# Patient Record
Sex: Female | Born: 2000 | Race: White | Hispanic: No | Marital: Single | Smoking: Never smoker
Health system: Southern US, Community
[De-identification: ages and names within clinical notes are randomized; demographics above are authoritative.]

## PROBLEM LIST (undated history)

## (undated) DIAGNOSIS — Z9889 Other specified postprocedural states: Secondary | ICD-10-CM

## (undated) DIAGNOSIS — J45909 Unspecified asthma, uncomplicated: Secondary | ICD-10-CM

## (undated) DIAGNOSIS — G43909 Migraine, unspecified, not intractable, without status migrainosus: Secondary | ICD-10-CM

## (undated) DIAGNOSIS — J40 Bronchitis, not specified as acute or chronic: Secondary | ICD-10-CM

## (undated) DIAGNOSIS — F419 Anxiety disorder, unspecified: Secondary | ICD-10-CM

## (undated) DIAGNOSIS — R112 Nausea with vomiting, unspecified: Secondary | ICD-10-CM

## (undated) DIAGNOSIS — F909 Attention-deficit hyperactivity disorder, unspecified type: Secondary | ICD-10-CM

## (undated) HISTORY — DX: Bronchitis, not specified as acute or chronic: J40

## (undated) HISTORY — PX: TYMPANOSTOMY TUBE PLACEMENT: SHX32

## (undated) HISTORY — PX: TONSILLECTOMY AND ADENOIDECTOMY: SUR1326

## (undated) HISTORY — DX: Attention-deficit hyperactivity disorder, unspecified type: F90.9

---
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
## 2022-02-13 ENCOUNTER — Encounter: Payer: Self-pay | Admitting: Emergency Medicine

## 2022-02-13 ENCOUNTER — Emergency Department
Admission: EM | Admit: 2022-02-13 | Discharge: 2022-02-13 | Disposition: A | Discharge status: Home / Self Care | Payer: Medicaid Other | Attending: Emergency Medicine | Admitting: Emergency Medicine

## 2022-02-13 DIAGNOSIS — O26893 Other specified pregnancy related conditions, third trimester: Secondary | ICD-10-CM | POA: Diagnosis present

## 2022-02-13 DIAGNOSIS — O99713 Diseases of the skin and subcutaneous tissue complicating pregnancy, third trimester: Secondary | ICD-10-CM | POA: Insufficient documentation

## 2022-02-13 DIAGNOSIS — L237 Allergic contact dermatitis due to plants, except food: Secondary | ICD-10-CM | POA: Insufficient documentation

## 2022-02-13 DIAGNOSIS — Z3A31 31 weeks gestation of pregnancy: Secondary | ICD-10-CM | POA: Diagnosis not present

## 2022-02-13 MED ORDER — DIPHENHYDRAMINE HCL 25 MG PO CAPS
25.0000 mg | ORAL_CAPSULE | Freq: Once | ORAL | Status: AC
Start: 2022-02-13 — End: 2022-02-13
  Administered 2022-02-13: 25 mg via ORAL
  Filled 2022-02-13: qty 1

## 2022-02-13 MED ORDER — BETAMETHASONE VALERATE 0.1 % EX OINT: 1.0000 "application " | TOPICAL_OINTMENT | Freq: Two times a day (BID) | CUTANEOUS | 0 refills | Status: DC

## 2022-02-13 NOTE — ED Triage Notes (Signed)
Pt presents via POV 31 weeks preg with a rash from exposure to poison ivy last week. She notes trying OTC benadryl, baking soda baths, and oatmeal bath. Erythematous rash on her face, torso, and back. Respirations equal and unlabored.

## 2022-02-13 NOTE — ED Notes (Signed)
E-signature pad unavailable - Pt verbalized understanding of D/C information - no additional concerns at this time.  

## 2022-02-13 NOTE — ED Provider Notes (Signed)
[facility] Provider Note    None    (approximate)   History   Rash   HPI  Dominique Howard is a 21 y.o. female who presents to the ED for evaluation of Rash   Patient is [redacted] weeks gestation and presents to the ED with concerns for poison ivy rash.She reports her boyfriend works in Building control survey[employer] and got poison ivy oils on his clothes at work and left them out on the countertop in the bathroom.  She moved to these close around and since developed a rash on the right side of her face, her chest and her abdomen. Has been dealing with a rash for the past couple days now, using Benadryl with minimal relief.  She reports poor sleep that she comes in tonight.  Denies any gestational concerns.   Physical Exam   Triage Vital Signs: ED Triage Vitals  Enc Vitals Group     BP 02/13/22 0139 (!) 151/90     Pulse Rate 02/13/22 0139 98     Resp 02/13/22 0139 20     Temp 02/13/22 0139 98.8 F (37.1 C)     Temp Source 02/13/22 0139 Oral     SpO2 02/13/22 0139 98 %     Weight 02/13/22 0138 203 lb (92.1 kg)     Height 02/13/22 0138 5\' 1"  (1.549 m)     Head Circumference --      Peak Flow --      Pain Score 02/13/22 0138 0     Pain Loc --      Pain Edu? --      Excl. in GC? --     Most recent vital signs: Vitals:   02/13/22 0139  BP: (!) 151/90  Pulse: 98  Resp: 20  Temp: 98.8 F (37.1 C)  SpO2: 98%    General: Awake, no distress.  CV:  Good peripheral perfusion.  Resp:  Normal effort.  Abd:  No distention.  Gravid without tenderness. MSK:  No deformity noted.  Erythematous rash noted to the right side of the face, superior chest, gravid abdomen Neuro:  No focal deficits appreciated. Other:     ED Results / Procedures / Treatments   Labs (all labs ordered are listed, but only abnormal results are displayed) Labs Reviewed - No data to display  EKG   RADIOLOGY   Official radiology report(s): No results found.  PROCEDURES and  INTERVENTIONS:  Procedures  Medications - No data to display   IMPRESSION / MDM / ASSESSMENT AND PLAN / ED COURSE  I reviewed the triage vital signs and the nursing notes.  Differential diagnosis includes, but is not limited to, poison ivy/contact dermatitis, hives, herpes, labor  Pregnant patient presents to the ED with evidence of poison ivy contact dermatitis suitable for outpatient management.  No signs of gestational pathology or labor.  She looks systemically well and has a couple areas of evolving poison ivy.  Discussed inability to safely provide prednisone taper due to her pregnancy.  We discussed cautious use of small amounts of betamethasone cream in conjunction with Tylenol and Benadryl.  Discussed following up with OB and return precautions.      FINAL CLINICAL IMPRESSION(S) / ED DIAGNOSES   Final diagnoses:  None     Rx / DC Orders   ED Discharge Orders     None        Note:  This document was prepared using Dragon voice recognition software and may include  unintentional dictation errors.   Delton Prairie, MD 02/13/22 [phone]

## 2022-02-13 NOTE — Discharge Instructions (Signed)
Please use small quantities of this steroid cream to avoid absorbing too much into your system and affecting the baby.  Continue to use Tylenol and Benadryl

## 2022-03-29 ENCOUNTER — Observation Stay
Admission: EM | Admit: 2022-03-29 | Discharge: 2022-03-29 | Disposition: A | Discharge status: Home / Self Care | Payer: Medicaid Other | Attending: Obstetrics and Gynecology | Admitting: Obstetrics and Gynecology

## 2022-03-29 ENCOUNTER — Other Ambulatory Visit: Payer: Self-pay

## 2022-03-29 ENCOUNTER — Other Ambulatory Visit: Payer: Self-pay | Admitting: Obstetrics and Gynecology

## 2022-03-29 ENCOUNTER — Encounter: Payer: Self-pay | Admitting: Obstetrics and Gynecology

## 2022-03-29 DIAGNOSIS — Z3A37 37 weeks gestation of pregnancy: Secondary | ICD-10-CM | POA: Diagnosis not present

## 2022-03-29 DIAGNOSIS — O99513 Diseases of the respiratory system complicating pregnancy, third trimester: Secondary | ICD-10-CM | POA: Insufficient documentation

## 2022-03-29 DIAGNOSIS — Z79899 Other long term (current) drug therapy: Secondary | ICD-10-CM | POA: Insufficient documentation

## 2022-03-29 DIAGNOSIS — O321XX0 Maternal care for breech presentation, not applicable or unspecified: Secondary | ICD-10-CM | POA: Diagnosis present

## 2022-03-29 DIAGNOSIS — J45909 Unspecified asthma, uncomplicated: Secondary | ICD-10-CM | POA: Diagnosis not present

## 2022-03-29 DIAGNOSIS — O329XX0 Maternal care for malpresentation of fetus, unspecified, not applicable or unspecified: Secondary | ICD-10-CM | POA: Diagnosis present

## 2022-03-29 DIAGNOSIS — Z7722 Contact with and (suspected) exposure to environmental tobacco smoke (acute) (chronic): Secondary | ICD-10-CM | POA: Diagnosis not present

## 2022-03-29 LAB — CBC
HCT: 37.7 % (ref 36.0–46.0)
Hemoglobin: 12.2 g/dL (ref 12.0–15.0)
MCH: 28.8 pg (ref 26.0–34.0)
MCHC: 32.4 g/dL (ref 30.0–36.0)
MCV: 88.9 fL (ref 80.0–100.0)
Platelets: 200 10*3/uL (ref 150–400)
RBC: 4.24 MIL/uL (ref 3.87–5.11)
RDW: 14.7 % (ref 11.5–15.5)
WBC: 12 10*3/uL — ABNORMAL HIGH (ref 4.0–10.5)
nRBC: 0 % (ref 0.0–0.2)

## 2022-03-29 MED ORDER — MINERAL OIL LIGHT OIL: TOPICAL_OIL | Freq: Once | Status: DC

## 2022-03-29 MED ORDER — LACTATED RINGERS IV SOLN: INTRAVENOUS | Status: DC

## 2022-03-29 MED ORDER — FENTANYL CITRATE (PF) 100 MCG/2ML IJ SOLN
50.0000 ug | Freq: Once | INTRAMUSCULAR | Status: AC
  Administered 2022-03-29: 50 ug via INTRAVENOUS
  Filled 2022-03-29: qty 2

## 2022-03-29 MED ORDER — TERBUTALINE SULFATE 1 MG/ML IJ SOLN
0.2500 mg | Freq: Once | INTRAMUSCULAR | Status: AC
  Administered 2022-03-29: 0.25 mg via SUBCUTANEOUS
  Filled 2022-03-29: qty 1

## 2022-03-29 NOTE — Discharge Instructions (Signed)
Please return to hospital for any of the following symptoms:  -less than 10 fetal movements in a 2-hour period -contractions every 5 minutes for a 2-hour period  -bright, red vaginal bleeding -leakage of fluid  -"rock hard" abdomen that is not relaxing   

## 2022-03-29 NOTE — OB Triage Note (Signed)
Discharge instructions, labor precautions, and follow-up care reviewed with patient and significant other. All questions answered. Patient verbalized understanding. Discharged ambulatory off unit.   

## 2022-03-29 NOTE — Discharge Summary (Signed)
DC Summary Discharge Summary   Patient ID: Dominique Howard [id number] 21 y.o. Aug 05, 2001  Admit date: 03/29/2022  Discharge date: 03/29/2022  Principal Diagnoses:  1) malpresentation of fetus, antepartum 2) intrauterine pregnancy at [redacted]w[redacted]d   Secondary Diagnoses:  1) malpresentation of fetus, antepartum 2) intrauterine pregnancy at [redacted]w[redacted]d   Procedures performed during the hospitalization:  External cephalic version (failed)  HPI: 21 y.o. G1P0 female who has a fetus in breech presentation. She was counseled about the risks and benefits of the procedure and signed consents. Prior to iv placement the fetal breech was verified.  She underwent an attempt at external cephalic version, which was unsuccessful.    Past Medical History:  Diagnosis Date   ADHD (attention deficit hyperactivity disorder)    Anxiety    Asthma    well controlled   Bronchitis     Past Surgical History:  Procedure Laterality Date   LAPAROTOMY N/A 10/12/2021   Procedure: EXPLORATORY LAPAROTOMY;  Surgeon: Schermerhorn, Ihor Austin, MD;  Location: [facility] ORS;  Service: Gynecology;  Laterality: N/A;   OVARIAN CYST REMOVAL Bilateral 10/12/2021   Procedure: EX LAP BILATERAL OVARIAN CYSTECOMY;  Surgeon: Schermerhorn, Ihor Austin, MD;  Location: [facility] ORS;  Service: Gynecology;  Laterality: Bilateral;   TONSILLECTOMY AND ADENOIDECTOMY     TYMPANOSTOMY TUBE PLACEMENT     x2    No Known Allergies  Social History   Tobacco Use   Smoking status: Never    Passive exposure: Yes   Smokeless tobacco: Never  Vaping Use   Vaping Use: Former   Quit date: 08/02/2021  Substance Use Topics   Alcohol use: No   Drug use: Not Currently    Types: Marijuana    Comment: last used prior to finding out she was pregnant    Family History  Problem Relation Age of Onset   Hypertension Mother    Asthma Mother    Migraines Mother    Arthritis Mother    Cancer Mother        ovarian    Heart failure Mother    Asthma Sister    Mental  illness Sister    Obesity Sister    Asthma Brother    Arthritis Maternal Grandmother    Osteoporosis Maternal Grandmother    Thyroid disease Maternal Grandfather    Heart disease Maternal Grandfather    Stroke Maternal Grandfather    Hypertension Paternal Grandmother    Stroke Paternal Grandmother    Asthma Brother     Hospital Course:  She was admitted for external cephalic version, which was unsuccessful. She and the fetus were monitored for 2 hours after with a category 1 tracing noted the whole time. She noted no vaginal bleeding or leaking of fluid. She was discharged in stable condition with a c-section scheduled for 04/10/22.    Discharge Exam: BP 133/76 (BP Location: Left Arm)   Pulse 90   Temp 98.5 F (36.9 C) (Oral)   Resp 18   Ht 5\' 2"  (1.575 m)   Wt 95.7 kg   LMP 07/24/2021   BMI 38.59 kg/m  Physical Exam Constitutional:      General: She is not in acute distress.    Appearance: Normal appearance.  HENT:     Head: Normocephalic and atraumatic.  Eyes:     General: No scleral icterus.    Conjunctiva/sclera: Conjunctivae normal.  Neurological:     General: No focal deficit present.     Mental Status: She is alert and oriented to person,  place, and time.     Cranial Nerves: No cranial nerve deficit.  Psychiatric:        Mood and Affect: Mood normal.        Behavior: Behavior normal.        Judgment: Judgment normal.      Condition at Discharge: Stable  Complications affecting treatment: None  Discharge Medications:  Allergies as of 03/29/2022   No Known Allergies      Medication List     STOP taking these medications    gabapentin 300 MG capsule Commonly known as: Neurontin   HYDROcodone-acetaminophen 5-325 MG tablet Commonly known as: NORCO/VICODIN   metoCLOPramide 10 MG tablet Commonly known as: REGLAN   ondansetron 4 MG tablet Commonly known as: Zofran       TAKE these medications    albuterol 108 (90 Base) MCG/ACT  inhaler Commonly known as: VENTOLIN HFA Take 1-2 puffs by mouth every 6 (six) hours as needed for shortness of breath or wheezing.   betamethasone valerate ointment 0.1 % Commonly known as: VALISONE Apply 1 application  topically 2 (two) times daily.   prenatal vitamin w/FE, FA 27-1 MG Tabs tablet Take 1 tablet by mouth daily at 12 noon.         Follow-up arrangements:  Keep routine prenatal appointment   Discharge Disposition: Discharge disposition: 01-Home or Self Care    Signed: Thomasene Mohair, MD, [facility] 03/29/2022 2:00 PM

## 2022-03-29 NOTE — Procedures (Signed)
The risks, benefits, and alternatives of the procedure were reviewed with the patient.  Greater than 1 hour of fetal monitoring with category 1 tracing obtained prior to procedure.  The patient already had an IV. She was given fentanyl 50 mcg IV x 1. She was given terbutaline [place] x 1.  Using intermittent ultrasound for fetal positioning and heart rate monitoring, the fetal breech was attempted to be elevated from the pelvis.  A small amount of elevation was obtained with an attempted forward roll of the fetus x 3.  Reverse roll of the fetus was attempted unsuccessfully.  The fetal breech was never fully able to be elevated from the pelvis.  The procedure were terminated at this point.  The patient tolerated the procedure well.  No evidence of fetal distress, no vaginal bleeding, no rupture of membranes.   Patient and fetus will be monitored for about 1-2 hours to ensure no labor and reassuring fetal status.  Fetal heart rate monitored intermittently by ultrasound and documented in EPIC at the time of monitoring.  Thomasene Mohair, MD 03/29/2022 1:47 PM

## 2022-03-29 NOTE — H&P (Signed)
OB History & Physical   History of Present Illness:  Chief Complaint: here for turning baby  HPI:  Dominique Howard is a 21 y.o. G1P0 female at [redacted]w[redacted]d.  Her pregnancy has been complicated by ovarian cysts which were removed early in pregnancy.  Her fetus has also been breech .    She denies contractions.   She denies leakage of fluid.   She denies vaginal bleeding.   She reports fetal movement.    Total weight gain for pregnancy: Not found.   Obstetrical Problem List: G1  Problems (from 03/29/22 to present)     No problems associated with this episode.        Maternal Medical History:   Past Medical History:  Diagnosis Date   ADHD (attention deficit hyperactivity disorder)    Anxiety    Asthma    well controlled   Bronchitis     Past Surgical History:  Procedure Laterality Date   LAPAROTOMY N/A 10/12/2021   Procedure: EXPLORATORY LAPAROTOMY;  Surgeon: Schermerhorn, Ihor Austin, MD;  Location: [facility] ORS;  Service: Gynecology;  Laterality: N/A;   OVARIAN CYST REMOVAL Bilateral 10/12/2021   Procedure: EX LAP BILATERAL OVARIAN CYSTECOMY;  Surgeon: Schermerhorn, Ihor Austin, MD;  Location: [facility] ORS;  Service: Gynecology;  Laterality: Bilateral;   TONSILLECTOMY AND ADENOIDECTOMY     TYMPANOSTOMY TUBE PLACEMENT     x2    No Known Allergies  Prior to Admission medications   Medication Sig Start Date End Date Taking? Authorizing Provider  prenatal vitamin w/FE, FA (PRENATAL 1 + 1) 27-1 MG TABS tablet Take 1 tablet by mouth daily at 12 noon.   Yes [provider]  albuterol (VENTOLIN HFA) 108 (90 Base) MCG/ACT inhaler Take 1-2 puffs by mouth every 6 (six) hours as needed for shortness of breath or wheezing. 11/06/16   [provider]  betamethasone valerate ointment (VALISONE) 0.1 % Apply 1 application  topically 2 (two) times daily. 02/13/22   Delton Prairie, MD  gabapentin (NEURONTIN) 300 MG capsule Take 1 capsule (300 mg total) by mouth at bedtime for 10 days. 10/13/21  10/23/21  Schermerhorn, Ihor Austin, MD  HYDROcodone-acetaminophen (NORCO/VICODIN) 5-325 MG tablet Take 1 tablet by mouth every 4 (four) hours as needed for moderate pain. Patient not taking: Reported on 03/29/2022 10/13/21 10/13/22  Schermerhorn, Ihor Austin, MD  metoCLOPramide (REGLAN) 10 MG tablet Take 10 mg by mouth every 6 (six) hours as needed for nausea.    [provider]  ondansetron (ZOFRAN) 4 MG tablet Take 1 tablet (4 mg total) by mouth daily as needed for nausea or vomiting. 10/13/21 10/13/22  Schermerhorn, Ihor Austin, MD    OB History  Gravida Para Term Preterm AB Living  1            SAB IAB Ectopic Multiple Live Births               # Outcome Date GA Lbr Len/2nd Weight Sex Delivery Anes PTL Lv  1 Current             [facility]  Social History: She  reports that she has never smoked. She has been exposed to tobacco smoke. She has never used smokeless tobacco. She reports that she does not currently use drugs after having used the following drugs: Marijuana. She reports that she does not drink alcohol.  Family History: family history includes Arthritis in her maternal grandmother and mother; Asthma in her brother, brother, mother, and sister;  Cancer in her mother; Heart disease in her maternal grandfather; Heart failure in her mother; Hypertension in her mother and paternal grandmother; Mental illness in her sister; Migraines in her mother; Obesity in her sister; Osteoporosis in her maternal grandmother; Stroke in her maternal grandfather and paternal grandmother; Thyroid disease in her maternal grandfather.   Review of Systems  Constitutional: Negative.   HENT: Negative.    Eyes: Negative.   Respiratory: Negative.    Cardiovascular: Negative.   Gastrointestinal: Negative.   Genitourinary: Negative.   Musculoskeletal: Negative.   Skin: Negative.   Neurological: Negative.   Psychiatric/Behavioral: Negative.       Physical Exam:  BP 133/76  (BP Location: Left Arm)   Pulse 90   Temp 98.5 F (36.9 C) (Oral)   Resp 18   Ht 5\' 2"  (1.575 m)   Wt 95.7 kg   LMP 07/24/2021   BMI 38.59 kg/m   Physical Exam Constitutional:      General: She is not in acute distress.    Appearance: Normal appearance. She is well-developed.  HENT:     Head: Normocephalic and atraumatic.  Eyes:     General: No scleral icterus.    Conjunctiva/sclera: Conjunctivae normal.  Cardiovascular:     Rate and Rhythm: Normal rate and regular rhythm.     Heart sounds: No murmur heard.    No friction rub. No gallop.  Pulmonary:     Effort: Pulmonary effort is normal. No respiratory distress.     Breath sounds: Normal breath sounds. No wheezing or rales.  Abdominal:     General: Bowel sounds are normal. There is no distension.     Palpations: Abdomen is soft. There is mass.     Tenderness: There is no abdominal tenderness. There is no guarding or rebound.     Hernia: Hernia: gravid, NT.  Musculoskeletal:        General: Normal range of motion.     Cervical back: Normal range of motion and neck supple.  Neurological:     General: No focal deficit present.     Mental Status: She is alert and oriented to person, place, and time.     Cranial Nerves: No cranial nerve deficit.  Skin:    General: Skin is warm and dry.     Findings: No erythema.  Psychiatric:        Mood and Affect: Mood normal.        Behavior: Behavior normal.        Judgment: Judgment normal.      Pertinent Results:  Prenatal Labs Blood type/Rh O positive  Antibody screen negative   Baseline FHR: 135 beats/min   Variability: moderate   Accelerations: present   Decelerations: absent Contractions: present frequency: irritability Overall assessment: cat 1  Bedside Ultrasound:  Number of Fetus: 1  Presentation: complete breech  Fluid: normal  Placental Location: fundal  Assessment:  Dominique Howard is a 21 y.o. G1P0 female at [redacted]w[redacted]d with malpresentation of the fetus, here for  External Cephalic Version.   Plan:  Admit to Labor & Delivery  CBC, T&S, Clrs, IVF Consented regarding risks and benefits of ECV, including but not limited to: failure, pain, Rupture of membranes, placental abruption, labor, fetal intolerance.  Fetwal well-being: reassuring Failed ECV. See note.   [redacted]w[redacted]d, MD 03/29/2022 1:08 PM

## 2022-04-05 ENCOUNTER — Other Ambulatory Visit: Payer: Self-pay | Admitting: Certified Nurse Midwife

## 2022-04-05 ENCOUNTER — Encounter
Admission: RE | Admit: 2022-04-05 | Discharge: 2022-04-05 | Disposition: A | Discharge status: Home / Self Care | Payer: Medicaid Other | Source: Ambulatory Visit | Attending: Obstetrics and Gynecology | Admitting: Obstetrics and Gynecology

## 2022-04-05 VITALS — Ht 62.0 in | Wt 210.0 lb

## 2022-04-05 DIAGNOSIS — Z01812 Encounter for preprocedural laboratory examination: Secondary | ICD-10-CM

## 2022-04-05 DIAGNOSIS — O321XX0 Maternal care for breech presentation, not applicable or unspecified: Secondary | ICD-10-CM

## 2022-04-05 HISTORY — DX: Other specified postprocedural states: R11.2

## 2022-04-05 HISTORY — DX: Other specified postprocedural states: Z98.890

## 2022-04-05 NOTE — Progress Notes (Signed)
G1P0 at [redacted]w[redacted]d, LMP not c/w early [place] at [redacted]w[redacted]d.  Scheduled for primary cesarean section for breech position on 04/10/2022.   Prenatal provider: [facility] Pregnancy complicated by: Breech position of the fetus Dermoid cysts, surgically removed at 10/12/21 Varicella Non-immune TSH low-normal Teenage pregnancy Possible accessory lobe of the placenta (not seen on follow-up)  Prenatal Labs: Blood type/Rh O pos  Antibody screen neg  Rubella Immune  Varicella NON-Immune  RPR NR  HBsAg Neg  HIV NR  GC neg  Chlamydia neg  Genetic screening cfDNA negative, XX  1 hour GTT 106  3 hour GTT N/a  GBS pos   Tdap: received from [facility] Flu: out of season Contraception: TBD Feeding preference: TBD  ____ Janyce Llanos, CNM  Certified Nurse Midwife [facility] OB[facility]

## 2022-04-05 NOTE — Patient Instructions (Addendum)
Your procedure is scheduled on: Tuesday, August 8  Arrival Time: Please call Labor and Delivery the day before your scheduled C-Section to find out your arrival time. [phone].  Arrival: If your arrival time is prior to 6:00 am, please enter through the Emergency Room Entrance and you will be directed to Labor and Delivery. If your arrival time is 6:00 am or later, please enter the Medical Mall and follow the greeter's instructions.  Support Person  The designated support person is not considered a Equities trader.  Any inpatient support person will be given a band identifying them as the patient's chosen support person and may stay with the patient around the clock.  Visitor Passes   All visitors, including children, need an identification sticker when visiting. These stickers must be worn where they can be seen.   Labor & Delivery  Laboring women of any age may have one designated support person and one other visitor aged 8 and older at a time, and a doula registered with [place] for the labor and delivery phase of their stay. (Doulas not registered with [facility] are counted as visitors.) The visitor may switch with other visitors. Visitation is allowed 24 hours per day.  No children under the age of 24. The designated support person or a visitor may stay overnight in the room.  Mother Baby Unit, OB Specialty and Gynecological Care  A designated support person and three other visitors of any age may visit. The three visitors may switch out. The designated support person or a visitor aged 19 or older may stay overnight in the room. During the postpartum period (up to 6 weeks), if the mother is the patient, she can have her newborn stay with her if there is another support person present who can be responsible for the baby.   REMEMBER: Instructions that are not followed completely may result in serious medical risk, up to and including death; or upon the discretion of your surgeon  and anesthesiologist your surgery may need to be rescheduled.  Do not eat food or drink after midnight the night before surgery.  No gum chewing, lozengers or hard candies.  DO NOT TAKE ANY MEDICATIONS THE MORNING OF SURGERY   Use your albuterol inhaler on the day of surgery and bring to the hospital.  One week prior to surgery: starting today, August 3 Stop Anti-inflammatories (NSAIDS) such as Advil, Aleve, Ibuprofen, Motrin, Naproxen, Naprosyn and Aspirin based products such as Excedrin, Goodys Powder, BC Powder. Stop ANY OVER THE COUNTER supplements until after surgery. You may however, continue to take Tylenol if needed for pain up until the day of surgery.  No Alcohol for 24 hours before or after surgery.  No Smoking including e-cigarettes for 24 hours prior to surgery.  No chewable tobacco products for at least 6 hours prior to surgery.  No nicotine patches on the day of surgery.  Do not use any "recreational" drugs for at least a week prior to your surgery.  Please be advised that the combination of cocaine and anesthesia may have negative outcomes, up to and including death. If you test positive for cocaine, your surgery will be cancelled.  On the morning of surgery brush your teeth with toothpaste and water, you may rinse your mouth with mouthwash if you wish. Do not swallow any toothpaste or mouthwash.  Use CHG wipes as directed on instruction sheet.  Do not wear jewelry, make-up, hairpins, clips or nail polish.  Do not wear lotions, powders, or  perfumes.   Do not shave body from the neck down 48 hours prior to surgery just in case you cut yourself which could leave a site for infection.  Also, freshly shaved skin may become irritated if using the CHG soap.  Contact lenses, hearing aids and dentures may not be worn into surgery.  Do not bring valuables to the hospital. [facility] is not responsible for any missing/lost belongings or valuables.   Notify your doctor  if there is any change in your medical condition (cold, fever, infection).  Wear comfortable clothing (specific to your surgery type) to the hospital.  After surgery, you can help prevent lung complications by doing breathing exercises.  Take deep breaths and cough every 1-2 hours. Your doctor may order a device called an Incentive Spirometer to help you take deep breaths. When coughing or sneezing, hold a pillow firmly against your incision with both hands. This is called "splinting." Doing this helps protect your incision. It also decreases belly discomfort.  Please call the Pre-admissions Testing Dept. at [phone] if you have any questions about these instructions.  Preparing the Skin Before Surgery     To help prevent the risk of infection at your surgical site, we are now providing you with rinse-free Sage 2% Chlorhexidine Gluconate (CHG) disposable wipes.  The night before surgery: Shower or bathe with warm water. Do not apply perfume, lotions, powders. Wait one hour after shower. Skin should be dry and cool. Open Sage wipe package - 6 disposable cloths are inside. Wipe body using one cloth for the right arm, one cloth for the left arm, one cloth for the right leg, one cloth for the left leg, one cloth for the chest/abdomen area (do not use on breasts if breast feeding), and one cloth for the back. 5. Do not rinse, allow to dry. 6. Skin may fee "tacky" for several minutes. 7. Dress in clean clothes. 8. Place clean sheets on your bed and do not sleep with pets.  REPEAT ABOVE ON THE MORNING OF SURGERY PRIOR TO ARRIVING TO THE HOSPITAL.

## 2022-04-09 ENCOUNTER — Encounter: Payer: Self-pay | Admitting: Urgent Care

## 2022-04-09 ENCOUNTER — Inpatient Hospital Stay: Admission: RE | Admit: 2022-04-09 | Payer: Medicaid Other | Source: Ambulatory Visit

## 2022-04-09 ENCOUNTER — Other Ambulatory Visit: Payer: Self-pay

## 2022-04-09 ENCOUNTER — Encounter: Payer: Self-pay | Admitting: Obstetrics and Gynecology

## 2022-04-09 ENCOUNTER — Observation Stay
Admission: AD | Admit: 2022-04-09 | Discharge: 2022-04-09 | Disposition: A | Discharge status: Home / Self Care | Payer: Medicaid Other | Source: Home / Self Care | Attending: Certified Nurse Midwife | Admitting: Certified Nurse Midwife

## 2022-04-09 DIAGNOSIS — Z3A39 39 weeks gestation of pregnancy: Secondary | ICD-10-CM | POA: Insufficient documentation

## 2022-04-09 DIAGNOSIS — O99513 Diseases of the respiratory system complicating pregnancy, third trimester: Secondary | ICD-10-CM | POA: Insufficient documentation

## 2022-04-09 DIAGNOSIS — J45909 Unspecified asthma, uncomplicated: Secondary | ICD-10-CM | POA: Insufficient documentation

## 2022-04-09 DIAGNOSIS — O479 False labor, unspecified: Secondary | ICD-10-CM | POA: Diagnosis present

## 2022-04-09 DIAGNOSIS — Z7722 Contact with and (suspected) exposure to environmental tobacco smoke (acute) (chronic): Secondary | ICD-10-CM | POA: Insufficient documentation

## 2022-04-09 DIAGNOSIS — O471 False labor at or after 37 completed weeks of gestation: Secondary | ICD-10-CM | POA: Insufficient documentation

## 2022-04-09 LAB — CBC
HCT: 34.9 % — ABNORMAL LOW (ref 36.0–46.0)
Hemoglobin: 11.3 g/dL — ABNORMAL LOW (ref 12.0–15.0)
MCH: 28.3 pg (ref 26.0–34.0)
MCHC: 32.4 g/dL (ref 30.0–36.0)
MCV: 87.5 fL (ref 80.0–100.0)
Platelets: 179 10*3/uL (ref 150–400)
RBC: 3.99 MIL/uL (ref 3.87–5.11)
RDW: 15.2 % (ref 11.5–15.5)
WBC: 10.6 10*3/uL — ABNORMAL HIGH (ref 4.0–10.5)
nRBC: 0 % (ref 0.0–0.2)

## 2022-04-09 LAB — TYPE AND SCREEN
ABO/RH(D): O POS
Antibody Screen: NEGATIVE

## 2022-04-09 MED ORDER — PRENATAL MULTIVITAMIN CH: 1.0000 | ORAL_TABLET | Freq: Every day | ORAL | Status: DC

## 2022-04-09 MED ORDER — ACETAMINOPHEN 325 MG PO TABS: 650.0000 mg | ORAL_TABLET | ORAL | Status: DC | PRN

## 2022-04-09 MED ORDER — CALCIUM CARBONATE ANTACID 500 MG PO CHEW: 2.0000 | CHEWABLE_TABLET | ORAL | Status: DC | PRN

## 2022-04-09 MED ORDER — LACTATED RINGERS IV BOLUS
1000.0000 mL | Freq: Once | INTRAVENOUS | Status: AC
  Administered 2022-04-09: 1000 mL via INTRAVENOUS

## 2022-04-09 NOTE — Discharge Summary (Signed)
Dominique Howard is a 21 y.o. female. She is at [redacted]w[redacted]d gestation. Patient's last menstrual period was 07/24/2021. Estimated Date of Delivery: 04/16/22  Prenatal care site: [facility]  Current pregnancy complicated by:  Breech position of the fetus Dermoid cysts, surgically removed at 10/12/21 Varicella Non-immune TSH low-normal Teenage pregnancy Possible accessory lobe of the placenta (not seen on follow-up)  Chief complaint: uterine contractions  Reports regular uterine contractions since last night. The contractions are not getting stronger or closer together. She has only had one cup of water today. She has a c-section scheduled for breech position tomorrow 04/10/22 @ 0730.  S: Resting comfortably. Occasional CTX, no VB.no LOF,  Active fetal movement. Denies: HA, visual changes, SOB, or RUQ/epigastric pain  Maternal Medical History:   Past Medical History:  Diagnosis Date   ADHD (attention deficit hyperactivity disorder)    Anxiety    Asthma    well controlled   Bronchitis    PONV (postoperative nausea and vomiting)     Past Surgical History:  Procedure Laterality Date   LAPAROTOMY N/A 10/12/2021   Procedure: EXPLORATORY LAPAROTOMY;  Surgeon: Schermerhorn, Ihor Austin, MD;  Location: [facility] ORS;  Service: Gynecology;  Laterality: N/A;   OVARIAN CYST REMOVAL Bilateral 10/12/2021   Procedure: EX LAP BILATERAL OVARIAN CYSTECOMY;  Surgeon: Schermerhorn, Ihor Austin, MD;  Location: [facility] ORS;  Service: Gynecology;  Laterality: Bilateral;   TONSILLECTOMY AND ADENOIDECTOMY     TYMPANOSTOMY TUBE PLACEMENT     x2    No Known Allergies  Prior to Admission medications   Medication Sig Start Date End Date Taking? Authorizing Provider  albuterol (VENTOLIN HFA) 108 (90 Base) MCG/ACT inhaler Take 1-2 puffs by mouth every 6 (six) hours as needed for shortness of breath or wheezing. 11/06/16   [provider]  prenatal vitamin w/FE, FA (PRENATAL 1 + 1) 27-1 MG TABS tablet  Take 1 tablet by mouth daily at 12 noon.    [provider]      Social History: She  reports that she has never smoked. She has been exposed to tobacco smoke. She has never used smokeless tobacco. She reports that she does not currently use drugs after having used the following drugs: Marijuana. She reports that she does not drink alcohol.  Family History: family history includes Arthritis in her maternal grandmother and mother; Asthma in her brother, brother, mother, and sister; Cancer in her mother; Heart disease in her maternal grandfather; Heart failure in her mother; Hypertension in her mother and paternal grandmother; Mental illness in her sister; Migraines in her mother; Obesity in her sister; Osteoporosis in her maternal grandmother; Stroke in her maternal grandfather and paternal grandmother; Thyroid disease in her maternal grandfather.  no history of gyn cancers  Review of Systems: A full review of systems was performed and negative except as noted in the HPI.     O:  LMP 07/24/2021  Results for orders placed or performed during the hospital encounter of 04/09/22 (from the past 48 hour(s))  CBC   Collection Time: 04/09/22  4:46 PM  Result Value Ref Range   WBC 10.6 (H) 4.0 - 10.5 K/uL   RBC 3.99 3.87 - 5.11 MIL/uL   Hemoglobin 11.3 (L) 12.0 - 15.0 g/dL   HCT 48.5 (L) 46.2 - 70.3 %   MCV 87.5 80.0 - 100.0 fL   MCH 28.3 26.0 - 34.0 pg   MCHC 32.4 30.0 - 36.0 g/dL   RDW 50.0 93.8 - 18.2 %  Platelets 179 150 - 400 K/uL   nRBC 0.0 0.0 - 0.2 %  Type and screen [facility]   Collection Time: 04/09/22  4:46 PM  Result Value Ref Range   ABO/RH(D) O POS    Antibody Screen NEG    Sample Expiration      04/12/2022,2359 Performed at [facility], [address] [place], [place] [address]      Constitutional: NAD, AAOx3  HE/ENT: extraocular movements grossly intact, moist mucous membranes CV: RRR PULM: nl respiratory effort,  CTABL     Abd: gravid, non-tender, non-distended, soft      Ext: Non-tender, Nonedematous   Psych: mood appropriate, speech normal Pelvic: fingertip/thick/-3  Pelvic exam: normal external genitalia, vulva, vagina, cervix, uterus and adnexa.  Fetal  monitoring: Cat 1 Appropriate for GA Baseline: 125bpm Variability: moderate Accelerations: present x >2 Decelerations absent Contractions q32min  A/P: 21 y.o. [redacted]w[redacted]d here for antenatal surveillance for uterine contractions  Principle Diagnosis:  Normal pregnancy in third trimester  Labor: not present. Contractions became less intense after 1L IV bolus Fetal Wellbeing: Reassuring Cat 1 tracing. Reactive NST  Pre-op labs drawn for c-section in the morning. Reviewed NPO after midnight and to come for c-section at 0530. Return if contractions become stronger or closer together. D/c home stable, precautions reviewed, follow-up as scheduled.    Janyce Llanos, CNM 04/09/2022 6:42 PM

## 2022-04-09 NOTE — OB Triage Note (Signed)
Patient presents to her preadmission appointment and informed that team that she was having contractions.  Patient states that her contractions started yesterday around 7pm.  Patient denies any vaginal bleeding and reports no leaking of fluid but her normal discharge as increased.  Patient reports that the baby is moving.  Patient denies any headache, blurred vision, dizziness, or nausea.

## 2022-04-09 NOTE — Progress Notes (Signed)
Wallet given to her mom to keep.

## 2022-04-10 ENCOUNTER — Other Ambulatory Visit: Payer: Self-pay

## 2022-04-10 ENCOUNTER — Inpatient Hospital Stay: Payer: Medicaid Other | Admitting: Anesthesiology

## 2022-04-10 ENCOUNTER — Encounter: Payer: Self-pay | Admitting: Obstetrics and Gynecology

## 2022-04-10 ENCOUNTER — Inpatient Hospital Stay
Admission: RE | Admit: 2022-04-10 | Discharge: 2022-04-12 | DRG: 787 | Disposition: A | Discharge status: Home / Self Care | Payer: Medicaid Other | Source: Ambulatory Visit | Attending: Obstetrics | Admitting: Obstetrics

## 2022-04-10 ENCOUNTER — Encounter: Admission: EM | Disposition: A | Discharge status: Home / Self Care | Payer: Self-pay | Source: Home / Self Care

## 2022-04-10 ENCOUNTER — Encounter
Admission: RE | Disposition: A | Discharge status: Home / Self Care | Payer: Self-pay | Source: Ambulatory Visit | Attending: Obstetrics

## 2022-04-10 DIAGNOSIS — O328XX0 Maternal care for other malpresentation of fetus, not applicable or unspecified: Secondary | ICD-10-CM | POA: Diagnosis present

## 2022-04-10 DIAGNOSIS — Z01812 Encounter for preprocedural laboratory examination: Secondary | ICD-10-CM

## 2022-04-10 DIAGNOSIS — Z23 Encounter for immunization: Secondary | ICD-10-CM

## 2022-04-10 DIAGNOSIS — Z3A39 39 weeks gestation of pregnancy: Secondary | ICD-10-CM

## 2022-04-10 DIAGNOSIS — D62 Acute posthemorrhagic anemia: Secondary | ICD-10-CM | POA: Diagnosis not present

## 2022-04-10 DIAGNOSIS — O329XX0 Maternal care for malpresentation of fetus, unspecified, not applicable or unspecified: Secondary | ICD-10-CM | POA: Diagnosis present

## 2022-04-10 DIAGNOSIS — O9081 Anemia of the puerperium: Secondary | ICD-10-CM | POA: Diagnosis not present

## 2022-04-10 DIAGNOSIS — O9952 Diseases of the respiratory system complicating childbirth: Secondary | ICD-10-CM | POA: Diagnosis present

## 2022-04-10 DIAGNOSIS — O321XX0 Maternal care for breech presentation, not applicable or unspecified: Secondary | ICD-10-CM

## 2022-04-10 DIAGNOSIS — O99824 Streptococcus B carrier state complicating childbirth: Secondary | ICD-10-CM | POA: Diagnosis present

## 2022-04-10 DIAGNOSIS — J45909 Unspecified asthma, uncomplicated: Secondary | ICD-10-CM | POA: Diagnosis present

## 2022-04-10 LAB — RPR: RPR Ser Ql: NONREACTIVE

## 2022-04-10 SURGERY — Surgical Case
Anesthesia: Spinal

## 2022-04-10 MED ORDER — OXYTOCIN-SODIUM CHLORIDE 30-0.9 UT/500ML-% IV SOLN
INTRAVENOUS | Status: DC | PRN
  Administered 2022-04-10: 30 [IU] via INTRAVENOUS

## 2022-04-10 MED ORDER — ONDANSETRON HCL 4 MG/2ML IJ SOLN
4.0000 mg | Freq: Three times a day (TID) | INTRAMUSCULAR | Status: DC | PRN
  Administered 2022-04-10: 4 mg via INTRAVENOUS
  Filled 2022-04-10: qty 2

## 2022-04-10 MED ORDER — OXYCODONE-ACETAMINOPHEN 5-325 MG PO TABS: 2.0000 | ORAL_TABLET | ORAL | Status: DC | PRN

## 2022-04-10 MED ORDER — SODIUM CHLORIDE 0.9% FLUSH: 3.0000 mL | INTRAVENOUS | Status: DC | PRN

## 2022-04-10 MED ORDER — SIMETHICONE 80 MG PO CHEW
80.0000 mg | CHEWABLE_TABLET | Freq: Three times a day (TID) | ORAL | Status: DC
  Administered 2022-04-10 – 2022-04-12 (×6): 80 mg via ORAL
  Filled 2022-04-10 (×6): qty 1

## 2022-04-10 MED ORDER — ALBUTEROL SULFATE (2.5 MG/3ML) 0.083% IN NEBU: 3.0000 mL | INHALATION_SOLUTION | Freq: Four times a day (QID) | RESPIRATORY_TRACT | Status: DC | PRN

## 2022-04-10 MED ORDER — NALOXONE HCL 4 MG/10ML IJ SOLN: 1.0000 ug/kg/h | INTRAVENOUS | Status: DC | PRN

## 2022-04-10 MED ORDER — FENTANYL CITRATE (PF) 100 MCG/2ML IJ SOLN: 25.0000 ug | INTRAMUSCULAR | Status: DC | PRN

## 2022-04-10 MED ORDER — ONDANSETRON HCL 4 MG/2ML IJ SOLN
INTRAMUSCULAR | Status: DC | PRN
  Administered 2022-04-10: 4 mg via INTRAVENOUS

## 2022-04-10 MED ORDER — OXYTOCIN-SODIUM CHLORIDE 30-0.9 UT/500ML-% IV SOLN: 2.5000 [IU]/h | INTRAVENOUS | Status: DC

## 2022-04-10 MED ORDER — MORPHINE SULFATE (PF) 0.5 MG/ML IJ SOLN: INTRAMUSCULAR | Status: DC | PRN

## 2022-04-10 MED ORDER — CHLORHEXIDINE GLUCONATE 0.12 % MT SOLN
15.0000 mL | Freq: Once | OROMUCOSAL | Status: AC
  Administered 2022-04-10: 15 mL via OROMUCOSAL
  Filled 2022-04-10: qty 15

## 2022-04-10 MED ORDER — LACTATED RINGERS IV SOLN: INTRAVENOUS | Status: DC

## 2022-04-10 MED ORDER — OXYCODONE-ACETAMINOPHEN 5-325 MG PO TABS
1.0000 | ORAL_TABLET | ORAL | Status: DC | PRN
  Administered 2022-04-11 – 2022-04-12 (×2): 1 via ORAL
  Filled 2022-04-10 (×2): qty 1

## 2022-04-10 MED ORDER — SOD CITRATE-CITRIC ACID 500-334 MG/5ML PO SOLN: 30.0000 mL | ORAL | Status: AC

## 2022-04-10 MED ORDER — OXYCODONE HCL 5 MG/5ML PO SOLN: 5.0000 mg | Freq: Once | ORAL | Status: AC | PRN

## 2022-04-10 MED ORDER — DIBUCAINE (PERIANAL) 1 % EX OINT: 1.0000 | TOPICAL_OINTMENT | CUTANEOUS | Status: DC | PRN

## 2022-04-10 MED ORDER — BUPIVACAINE HCL (PF) 0.5 % IJ SOLN: 5.0000 mL | Freq: Once | INTRAMUSCULAR | Status: DC

## 2022-04-10 MED ORDER — ACETAMINOPHEN 325 MG PO TABS
650.0000 mg | ORAL_TABLET | Freq: Four times a day (QID) | ORAL | Status: AC
  Administered 2022-04-10 – 2022-04-11 (×4): 650 mg via ORAL
  Filled 2022-04-10 (×5): qty 2

## 2022-04-10 MED ORDER — MORPHINE SULFATE (PF) 0.5 MG/ML IJ SOLN
INTRAMUSCULAR | Status: DC | PRN
  Administered 2022-04-10: .1 mg via INTRATHECAL

## 2022-04-10 MED ORDER — PRENATAL MULTIVITAMIN CH
1.0000 | ORAL_TABLET | Freq: Every day | ORAL | Status: DC
  Administered 2022-04-11: 1 via ORAL
  Filled 2022-04-10 (×2): qty 1

## 2022-04-10 MED ORDER — BUPIVACAINE HCL (PF) 0.5 % IJ SOLN
INTRAMUSCULAR | Status: AC
  Filled 2022-04-10: qty 10

## 2022-04-10 MED ORDER — IBUPROFEN 600 MG PO TABS
600.0000 mg | ORAL_TABLET | Freq: Four times a day (QID) | ORAL | Status: DC
  Administered 2022-04-11 – 2022-04-12 (×3): 600 mg via ORAL
  Filled 2022-04-10 (×4): qty 1

## 2022-04-10 MED ORDER — DIPHENHYDRAMINE HCL 50 MG/ML IJ SOLN: 12.5000 mg | INTRAMUSCULAR | Status: DC | PRN

## 2022-04-10 MED ORDER — NALOXONE HCL 0.4 MG/ML IJ SOLN: 0.4000 mg | INTRAMUSCULAR | Status: DC | PRN

## 2022-04-10 MED ORDER — BUPIVACAINE 0.25 % ON-Q PUMP DUAL CATH 400 ML
400.0000 mL | INJECTION | Status: DC
  Filled 2022-04-10: qty 400

## 2022-04-10 MED ORDER — EPHEDRINE SULFATE (PRESSORS) 50 MG/ML IJ SOLN
INTRAMUSCULAR | Status: AC
  Filled 2022-04-10: qty 1

## 2022-04-10 MED ORDER — COCONUT OIL OIL: 1.0000 | TOPICAL_OIL | Status: DC | PRN

## 2022-04-10 MED ORDER — ONDANSETRON HCL 4 MG/2ML IJ SOLN
INTRAMUSCULAR | Status: AC
  Filled 2022-04-10: qty 2

## 2022-04-10 MED ORDER — IBUPROFEN 600 MG PO TABS: 600.0000 mg | ORAL_TABLET | Freq: Four times a day (QID) | ORAL | Status: DC

## 2022-04-10 MED ORDER — LIDOCAINE HCL (PF) 1 % IJ SOLN
INTRAMUSCULAR | Status: DC | PRN
  Administered 2022-04-10: 3 mL via SUBCUTANEOUS

## 2022-04-10 MED ORDER — OXYTOCIN-SODIUM CHLORIDE 30-0.9 UT/500ML-% IV SOLN
INTRAVENOUS | Status: AC
  Filled 2022-04-10: qty 500

## 2022-04-10 MED ORDER — VARICELLA VIRUS VACCINE LIVE 1350 PFU/0.5ML IJ SUSR
0.5000 mL | INTRAMUSCULAR | Status: AC | PRN
  Administered 2022-04-12: 0.5 mL via SUBCUTANEOUS
  Filled 2022-04-10: qty 0.5

## 2022-04-10 MED ORDER — PHENYLEPHRINE HCL-NACL 20-0.9 MG/250ML-% IV SOLN
INTRAVENOUS | Status: DC | PRN
  Administered 2022-04-10: 30 ug/min via INTRAVENOUS

## 2022-04-10 MED ORDER — EPHEDRINE SULFATE (PRESSORS) 50 MG/ML IJ SOLN
INTRAMUSCULAR | Status: DC | PRN
  Administered 2022-04-10: 10 mg via INTRAVENOUS
  Administered 2022-04-10: 15 mg via INTRAVENOUS

## 2022-04-10 MED ORDER — BUPIVACAINE IN DEXTROSE 0.75-8.25 % IT SOLN
INTRATHECAL | Status: DC | PRN
  Administered 2022-04-10: 1.4 mL via INTRATHECAL

## 2022-04-10 MED ORDER — DIPHENHYDRAMINE HCL 25 MG PO CAPS: 25.0000 mg | ORAL_CAPSULE | ORAL | Status: DC | PRN

## 2022-04-10 MED ORDER — SOD CITRATE-CITRIC ACID 500-334 MG/5ML PO SOLN
ORAL | Status: AC
  Administered 2022-04-10: 30 mL via ORAL
  Filled 2022-04-10: qty 15

## 2022-04-10 MED ORDER — LACTATED RINGERS IV SOLN: Freq: Once | INTRAVENOUS | Status: DC

## 2022-04-10 MED ORDER — MORPHINE SULFATE (PF) 0.5 MG/ML IJ SOLN
INTRAMUSCULAR | Status: AC
  Filled 2022-04-10: qty 10

## 2022-04-10 MED ORDER — WITCH HAZEL-GLYCERIN EX PADS: 1.0000 | MEDICATED_PAD | CUTANEOUS | Status: DC | PRN

## 2022-04-10 MED ORDER — PHENYLEPHRINE HCL-NACL 20-0.9 MG/250ML-% IV SOLN
INTRAVENOUS | Status: AC
  Filled 2022-04-10: qty 250

## 2022-04-10 MED ORDER — DIPHENHYDRAMINE HCL 25 MG PO CAPS: 25.0000 mg | ORAL_CAPSULE | Freq: Four times a day (QID) | ORAL | Status: DC | PRN

## 2022-04-10 MED ORDER — FENTANYL CITRATE (PF) 100 MCG/2ML IJ SOLN
INTRAMUSCULAR | Status: DC | PRN
Start: 2022-04-10 — End: 2022-04-10
  Administered 2022-04-10: 15 ug via INTRATHECAL

## 2022-04-10 MED ORDER — CEFAZOLIN SODIUM-DEXTROSE 2-4 GM/100ML-% IV SOLN
2.0000 g | INTRAVENOUS | Status: AC
  Administered 2022-04-10: 2 g via INTRAVENOUS
  Filled 2022-04-10: qty 100

## 2022-04-10 MED ORDER — ORAL CARE MOUTH RINSE: 15.0000 mL | Freq: Once | OROMUCOSAL | Status: AC

## 2022-04-10 MED ORDER — BUPIVACAINE HCL (PF) 0.5 % IJ SOLN
INTRAMUSCULAR | Status: DC | PRN
  Administered 2022-04-10: 10 mL

## 2022-04-10 MED ORDER — OXYCODONE HCL 5 MG PO TABS
5.0000 mg | ORAL_TABLET | Freq: Once | ORAL | Status: AC | PRN
  Administered 2022-04-10: 5 mg via ORAL

## 2022-04-10 MED ORDER — FENTANYL CITRATE (PF) 100 MCG/2ML IJ SOLN
INTRAMUSCULAR | Status: AC
  Filled 2022-04-10: qty 2

## 2022-04-10 MED ORDER — MENTHOL 3 MG MT LOZG: 1.0000 | LOZENGE | OROMUCOSAL | Status: DC | PRN

## 2022-04-10 MED ORDER — SENNOSIDES-DOCUSATE SODIUM 8.6-50 MG PO TABS
2.0000 | ORAL_TABLET | ORAL | Status: DC
  Administered 2022-04-10 – 2022-04-11 (×2): 2 via ORAL
  Filled 2022-04-10 (×2): qty 2

## 2022-04-10 MED ORDER — FERROUS SULFATE 325 (65 FE) MG PO TABS
325.0000 mg | ORAL_TABLET | Freq: Two times a day (BID) | ORAL | Status: DC
  Administered 2022-04-10 – 2022-04-12 (×4): 325 mg via ORAL
  Filled 2022-04-10 (×4): qty 1

## 2022-04-10 MED ORDER — MEPERIDINE HCL 25 MG/ML IJ SOLN: 6.2500 mg | INTRAMUSCULAR | Status: DC | PRN

## 2022-04-10 MED ORDER — KETOROLAC TROMETHAMINE 30 MG/ML IJ SOLN
30.0000 mg | Freq: Four times a day (QID) | INTRAMUSCULAR | Status: AC
  Administered 2022-04-10 – 2022-04-11 (×4): 30 mg via INTRAVENOUS
  Filled 2022-04-10 (×5): qty 1

## 2022-04-10 MED ORDER — OXYTOCIN 10 UNIT/ML IJ SOLN
INTRAMUSCULAR | Status: AC
  Filled 2022-04-10: qty 1

## 2022-04-10 MED ORDER — OXYCODONE HCL 5 MG PO TABS
5.0000 mg | ORAL_TABLET | Freq: Four times a day (QID) | ORAL | Status: AC | PRN
  Filled 2022-04-10: qty 1

## 2022-04-10 MED ORDER — 0.9 % SODIUM CHLORIDE (POUR BTL) OPTIME
TOPICAL | Status: DC | PRN
  Administered 2022-04-10: 1000 mL

## 2022-04-10 MED ORDER — KETOROLAC TROMETHAMINE 30 MG/ML IJ SOLN: 30.0000 mg | Freq: Four times a day (QID) | INTRAMUSCULAR | Status: AC

## 2022-04-10 SURGICAL SUPPLY — 32 items
CATH KIT ON-Q SILVERSOAK 5 (CATHETERS) ×2 IMPLANT
CATH KIT ON-Q SILVERSOAK 5IN (CATHETERS) ×4 IMPLANT
DERMABOND ADVANCED (GAUZE/BANDAGES/DRESSINGS) ×1
DERMABOND ADVANCED .7 DNX12 (GAUZE/BANDAGES/DRESSINGS) ×1 IMPLANT
DRSG OPSITE POSTOP 4X10 (GAUZE/BANDAGES/DRESSINGS) ×2 IMPLANT
DRSG TELFA 3X8 NADH (GAUZE/BANDAGES/DRESSINGS) ×2 IMPLANT
ELECT CAUTERY BLADE 6.4 (BLADE) ×2 IMPLANT
ELECT REM PT RETURN 9FT ADLT (ELECTROSURGICAL) ×2
ELECTRODE REM PT RTRN 9FT ADLT (ELECTROSURGICAL) ×1 IMPLANT
GAUZE SPONGE 4X4 12PLY STRL (GAUZE/BANDAGES/DRESSINGS) ×2 IMPLANT
GLOVE BIO SURGEON STRL SZ7 (GLOVE) ×2 IMPLANT
GLOVE SURG UNDER LTX SZ7.5 (GLOVE) ×2 IMPLANT
GOWN STRL REUS W/ TWL LRG LVL3 (GOWN DISPOSABLE) ×3 IMPLANT
GOWN STRL REUS W/TWL LRG LVL3 (GOWN DISPOSABLE) ×3
MANIFOLD NEPTUNE II (INSTRUMENTS) ×2 IMPLANT
MAT PREVALON FULL STRYKER (MISCELLANEOUS) ×2 IMPLANT
NS IRRIG 1000ML POUR BTL (IV SOLUTION) ×2 IMPLANT
PACK C SECTION AR (MISCELLANEOUS) ×2 IMPLANT
PAD DRESSING TELFA 3X8 NADH (GAUZE/BANDAGES/DRESSINGS) ×1 IMPLANT
PAD OB MATERNITY 4.3X12.25 (PERSONAL CARE ITEMS) ×4 IMPLANT
PAD PREP 24X41 OB/GYN DISP (PERSONAL CARE ITEMS) ×2 IMPLANT
SCRUB CHG 4% DYNA-HEX 4OZ (MISCELLANEOUS) ×2 IMPLANT
STRIP CLOSURE SKIN 1/2X4 (GAUZE/BANDAGES/DRESSINGS) ×2 IMPLANT
SUT MNCRL 4-0 (SUTURE) ×1
SUT MNCRL 4-0 27XMFL (SUTURE) ×1
SUT PDS AB 1 TP1 96 (SUTURE) ×2 IMPLANT
SUT PLAIN GUT 0 (SUTURE) IMPLANT
SUT VIC AB 0 CTX 36 (SUTURE) ×3
SUT VIC AB 0 CTX36XBRD ANBCTRL (SUTURE) ×2 IMPLANT
SUTURE MNCRL 4-0 27XMF (SUTURE) ×1 IMPLANT
SWABSTK COMLB BENZOIN TINCTURE (MISCELLANEOUS) ×2 IMPLANT
WATER STERILE IRR 500ML POUR (IV SOLUTION) ×2 IMPLANT

## 2022-04-10 NOTE — Anesthesia Postprocedure Evaluation (Signed)
Anesthesia Post Note  Patient: Arta Bruce  Procedure(s) Performed: CESAREAN SECTION  Patient location during evaluation: PACU Anesthesia Type: Spinal Level of consciousness: awake and alert Pain management: pain level controlled Vital Signs Assessment: post-procedure vital signs reviewed and stable Respiratory status: spontaneous breathing, nonlabored ventilation and respiratory function stable Cardiovascular status: blood pressure returned to baseline and stable Postop Assessment: no apparent nausea or vomiting Anesthetic complications: no   No notable events documented.   Last Vitals:  Vitals:   04/10/22 1122 04/10/22 1206  BP:  124/72  Pulse: (!) 55 78  Resp:  20  Temp:  36.4 C  SpO2:  99%    Last Pain:  Vitals:   04/10/22 1210  TempSrc:   PainSc: 3                  Foye Deer

## 2022-04-10 NOTE — Anesthesia Preprocedure Evaluation (Signed)
Anesthesia Evaluation  Patient identified by MRN, date of birth, ID band Patient awake    Reviewed: Allergy & Precautions, NPO status , Patient's Chart, lab work & pertinent test results  History of Anesthesia Complications (+) PONV and history of anesthetic complications  Airway Mallampati: III  TM Distance: >3 FB Neck ROM: full    Dental  (+) Chipped   Pulmonary neg shortness of breath, asthma ,    Pulmonary exam normal        Cardiovascular Exercise Tolerance: Good (-) hypertension(-) angina(-) Past MI Normal cardiovascular exam     Neuro/Psych Anxiety    GI/Hepatic negative GI ROS, neg GERD  ,  Endo/Other    Renal/GU   negative genitourinary   Musculoskeletal   Abdominal   Peds  Hematology negative hematology ROS (+)   Anesthesia Other Findings Past Medical History: No date: ADHD (attention deficit hyperactivity disorder) No date: Anxiety No date: Asthma     Comment:  well controlled No date: Bronchitis No date: PONV (postoperative nausea and vomiting)  Past Surgical History: 10/12/2021: LAPAROTOMY; N/A     Comment:  Procedure: EXPLORATORY LAPAROTOMY;  Surgeon:               Suzy Bouchard, MD;  Location: [facility] ORS;                Service: Gynecology;  Laterality: N/A; 10/12/2021: OVARIAN CYST REMOVAL; Bilateral     Comment:  Procedure: EX LAP BILATERAL OVARIAN CYSTECOMY;  Surgeon:              Schermerhorn, Ihor Austin, MD;  Location: [facility] ORS;                Service: Gynecology;  Laterality: Bilateral; No date: TONSILLECTOMY AND ADENOIDECTOMY No date: TYMPANOSTOMY TUBE PLACEMENT     Comment:  x2  BMI    Body Mass Index: 38.41 kg/m      Reproductive/Obstetrics (+) Pregnancy                             Anesthesia Physical Anesthesia Plan  ASA: 3  Anesthesia Plan: Spinal   Post-op Pain Management:    Induction:   PONV Risk Score and Plan:   Airway Management  Planned: Natural Airway and Nasal Cannula  Additional Equipment:   Intra-op Plan:   Post-operative Plan:   Informed Consent: I have reviewed the patients History and Physical, chart, labs and discussed the procedure including the risks, benefits and alternatives for the proposed anesthesia with the patient or authorized representative who has indicated his/her understanding and acceptance.     Dental Advisory Given  Plan Discussed with: Anesthesiologist, CRNA and Surgeon  Anesthesia Plan Comments: (Patient reports no bleeding problems and no anticoagulant use.  Plan for spinal with backup GA  Patient consented for risks of anesthesia including but not limited to:  - adverse reactions to medications - damage to eyes, teeth, lips or other oral mucosa - nerve damage due to positioning  - risk of bleeding, infection and or nerve damage from spinal that could lead to paralysis - risk of headache or failed spinal - damage to teeth, lips or other oral mucosa - sore throat or hoarseness - damage to heart, brain, nerves, lungs, other parts of body or loss of life  Patient voiced understanding.)        Anesthesia Quick Evaluation

## 2022-04-10 NOTE — H&P (Signed)
OB History & Physical   History of Present Illness:  Chief Complaint: Here for c-section  HPI:  Dominique Howard is a 21 y.o. G1P0 female at [redacted]w[redacted]d.  Her pregnancy has been complicated by ovarian cysts which were removed early in pregnancy. Her fetus is also breech and had a failed ECV a couple of weeks ago .    She denies contractions.   She denies leakage of fluid.   She denies vaginal bleeding.   She reports fetal movement.    Total weight gain for pregnancy: Not found.   Obstetrical Problem List: G1  Problems (from 03/29/22 to present)     No problems associated with this episode.        Maternal Medical History:   Past Medical History:  Diagnosis Date   ADHD (attention deficit hyperactivity disorder)    Anxiety    Asthma    well controlled   Bronchitis    PONV (postoperative nausea and vomiting)     Past Surgical History:  Procedure Laterality Date   LAPAROTOMY N/A 10/12/2021   Procedure: EXPLORATORY LAPAROTOMY;  Surgeon: Schermerhorn, Ihor Austin, MD;  Location: [facility] ORS;  Service: Gynecology;  Laterality: N/A;   OVARIAN CYST REMOVAL Bilateral 10/12/2021   Procedure: EX LAP BILATERAL OVARIAN CYSTECOMY;  Surgeon: Schermerhorn, Ihor Austin, MD;  Location: [facility] ORS;  Service: Gynecology;  Laterality: Bilateral;   TONSILLECTOMY AND ADENOIDECTOMY     TYMPANOSTOMY TUBE PLACEMENT     x2    No Known Allergies  Prior to Admission medications   Medication Sig Start Date End Date Taking? Authorizing Provider  albuterol (VENTOLIN HFA) 108 (90 Base) MCG/ACT inhaler Take 1-2 puffs by mouth every 6 (six) hours as needed for shortness of breath or wheezing. 11/06/16   [provider]  prenatal vitamin w/FE, FA (PRENATAL 1 + 1) 27-1 MG TABS tablet Take 1 tablet by mouth daily at 12 noon.    [provider]    OB History  Gravida Para Term Preterm AB Living  1            SAB IAB Ectopic Multiple Live Births               # Outcome Date GA Lbr Len/2nd Weight  Sex Delivery Anes PTL Lv  1 Current             [facility]renatal care site: [facility]  Social History: She  reports that she has never smoked. She has been exposed to tobacco smoke. She has never used smokeless tobacco. She reports that she does not currently use drugs after having used the following drugs: Marijuana. She reports that she does not drink alcohol.  Family History: family history includes Arthritis in her maternal grandmother and mother; Asthma in her brother, brother, mother, and sister; Cancer in her mother; Heart disease in her maternal grandfather; Heart failure in her mother; Hypertension in her mother and paternal grandmother; Mental illness in her sister; Migraines in her mother; Obesity in her sister; Osteoporosis in her maternal grandmother; Stroke in her maternal grandfather and paternal grandmother; Thyroid disease in her maternal grandfather.   Review of Systems:  Review of Systems  Constitutional: Negative.   HENT: Negative.    Eyes: Negative.   Respiratory: Negative.    Cardiovascular: Negative.   Gastrointestinal: Negative.   Genitourinary: Negative.   Musculoskeletal: Negative.   Skin: Negative.   Neurological: Negative.   Psychiatric/Behavioral: Negative.       Physical Exam:  BP Marland Kitchen)  130/56 (BP Location: Right Arm)   Pulse (!) 106   Temp 98.4 F (36.9 C) (Oral)   Resp 18   Ht 5\' 2"  (1.575 m)   Wt 95.3 kg   LMP 07/24/2021   SpO2 98%   BMI 38.41 kg/m   Physical Exam Constitutional:      General: She is not in acute distress.    Appearance: Normal appearance. She is well-developed.  HENT:     Head: Normocephalic and atraumatic.  Eyes:     General: No scleral icterus.    Conjunctiva/sclera: Conjunctivae normal.  Cardiovascular:     Rate and Rhythm: Normal rate and regular rhythm.     Heart sounds: No murmur heard.    No friction rub. No gallop.  Pulmonary:     Effort: Pulmonary effort is normal. No respiratory distress.     Breath  sounds: Normal breath sounds. No wheezing or rales.  Abdominal:     General: Bowel sounds are normal. There is no distension.     Palpations: Abdomen is soft. There is mass (gravid, NT).     Tenderness: There is no abdominal tenderness. There is no guarding or rebound.  Musculoskeletal:        General: Normal range of motion.     Cervical back: Normal range of motion and neck supple.  Neurological:     General: No focal deficit present.     Mental Status: She is alert and oriented to person, place, and time.     Cranial Nerves: No cranial nerve deficit.  Skin:    General: Skin is warm and dry.     Findings: No erythema.  Psychiatric:        Mood and Affect: Mood normal.        Behavior: Behavior normal.        Judgment: Judgment normal.      Pertinent Results:   Blood type/Rh O positive  Antibody screen negative   Baseline FHR: 130 beats/min   Variability: moderate   Accelerations: present   Decelerations: absent Contractions: present frequency: irritability Overall assessment: cat 1  Bedside Ultrasound:  Number of Fetus: 1  Presentation: breech  Fluid: subjectively normal  Placental Location: fundal  Assessment:  Dominique Howard is a 21 y.o. G1P0 female at [redacted]w[redacted]d with malpresentation of the fetus.   Plan:  Admit to Labor & Delivery  CBC, T&S, NPO, IVF GBS positive - will get pre-op abx.   Fetwal well-being: reassuring Verified fetal breech as still presenting. Patient agrees to move forward with recommended surgery   [redacted]w[redacted]d, MD 04/10/2022 7:34 AM

## 2022-04-10 NOTE — Discharge Summary (Signed)
Postpartum Discharge Summary     Patient Name: Dominique Howard DOB: 2000-10-17 MRN: [id number]  Date of admission: 04/10/2022 Delivery date:04/10/2022  Delivering provider: Prentice Docker D  Date of discharge: 04/10/2022  Admitting diagnosis: Delivery of pregnancy by cesarean section [O82] Intrauterine pregnancy: [redacted]w[redacted]d    Secondary diagnosis:  Principal Problem:   Malpresentation of fetus, antepartum Active Problems:   Delivery of pregnancy by cesarean section   [redacted] weeks gestation of pregnancy  Additional problems: ***    Discharge diagnosis: Term Pregnancy Delivered                                              Post partum procedures:{Postpartum procedures:23558} Augmentation: N/A Complications: None  Hospital course: Sceduled C/S   21y.o. yo G1P1001 at 371w1das admitted to the hospital 04/10/2022 for scheduled cesarean section with the following indication:Malpresentation.Delivery details are as follows:  Membrane Rupture Time/Date: 8:23 AM ,04/10/2022   Delivery Method:C-Section, Low Transverse  Details of operation can be found in separate operative note.  Patient had an uncomplicated postpartum course.  She is ambulating, tolerating a regular diet, passing flatus, and urinating well. Patient is discharged home in stable condition on  04/10/22        Newborn Data: Birth date:04/10/2022  Birth time:8:26 AM  Gender:Female  Living status:Living  Apgars: ,  Weight:     Magnesium Sulfate received: No BMZ received: No Rhophylac:N/A MMR:No T-DaP:{Tdap:23962} Flu: {F{VZC:58850}ransfusion:{Transfusion received:30440034}  Physical exam  Vitals:   04/10/22 0630 04/10/22 0634 04/10/22 0635 04/10/22 0700  BP:      Pulse:      Resp:      Temp:      TempSrc:      SpO2: 98%  99% 98%  Weight:  95.3 kg    Height:  5' 2" (1.575 m)     General: {Exam; general:21111117} Lochia: {Desc; appropriate/inappropriate:30686::"appropriate"} Uterine Fundus: {Desc;  firm/soft:30687} Incision: {Exam; incision:21111123} DVT Evaluation: {Exam; dvt:2111122} Labs: Lab Results  Component Value Date   WBC 10.6 (H) 04/09/2022   HGB 11.3 (L) 04/09/2022   HCT 34.9 (L) 04/09/2022   MCV 87.5 04/09/2022   PLT 179 04/09/2022      Latest Ref Rng & Units 10/16/2021   10:29 PM  CMP  Glucose 70 - 99 mg/dL 88   BUN 6 - 20 mg/dL 6   Creatinine 0.44 - 1.00 mg/dL 0.52   Sodium 135 - 145 mmol/L 130   Potassium 3.5 - 5.1 mmol/L 3.5   Chloride 98 - 111 mmol/L 100   CO2 22 - 32 mmol/L 20   Calcium 8.9 - 10.3 mg/dL 9.5   Total Protein 6.5 - 8.1 g/dL 7.3   Total Bilirubin 0.3 - 1.2 mg/dL 0.6   Alkaline Phos 38 - 126 U/L 48   AST 15 - 41 U/L 25   ALT 0 - 44 U/L 27    Edinburgh Score:     No data to display            After visit meds:  Allergies as of 04/10/2022   No Known Allergies   Med Rec must be completed prior to using this [facility]*        Discharge home in stable condition Infant Feeding: {Baby feeding:23562} Infant Disposition:{CHL IP OB HOME WITH MOYDXAJO:87867}ischarge instruction: per After Visit Summary and Postpartum booklet. Activity: Advance  as tolerated. Pelvic rest for 6 weeks.  Diet: routine diet Anticipated Birth Control: {Birth Control:23956} Postpartum Appointment:6 weeks Additional Postpartum F/U: Incision check 1 week Future Appointments:No future appointments. Follow up Visit:  Follow-up Information     Will Bonnet, MD. Go in 1 week(s).   Specialty: Obstetrics and Gynecology Why: For incision check (keep previously scheduled appt) Contact information: [place] [address] [phone]                 SIGNED: ***

## 2022-04-10 NOTE — Transfer of Care (Signed)
Immediate Anesthesia Transfer of Care Note  Patient: Dominique Howard  Procedure(s) Performed: CESAREAN SECTION  Patient Location: PACU and Mother/Baby  Anesthesia Type:Spinal  Level of Consciousness: awake and patient cooperative  Airway & Oxygen Therapy: Patient Spontanous Breathing  Post-op Assessment: Report given to RN and Post -op Vital signs reviewed and stable  Post vital signs: Reviewed and stable  Last Vitals:  Vitals Value Taken Time  BP    Temp    Pulse    Resp    SpO2      Last Pain:  Vitals:   04/10/22 0638  TempSrc:   PainSc: 1          Complications: No notable events documented.

## 2022-04-10 NOTE — Anesthesia Procedure Notes (Signed)
Spinal  Patient location during procedure: OR Start time: 04/10/2022 7:45 AM End time: 04/10/2022 7:50 AM Reason for block: surgical anesthesia Staffing Performed: resident/CRNA  Anesthesiologist: Piscitello, Precious Haws, MD Resident/CRNA: Jerrye Noble, CRNA Performed by: Jerrye Noble, CRNA Authorized by: Andria Frames, MD   Preanesthetic Checklist Completed: patient identified, IV checked, site marked, risks and benefits discussed, surgical consent, monitors and equipment checked, pre-op evaluation and timeout performed Spinal Block Patient position: sitting Prep: ChloraPrep Patient monitoring: heart rate, continuous pulse ox, blood pressure and cardiac monitor Approach: midline Location: L3-4 Injection technique: single-shot Needle Needle type: Introducer and Pencan  Needle gauge: 24 G Needle length: 9 cm Assessment Sensory level: T2 Events: CSF return Additional Notes Sterile aseptic technique used throughout the procedure.  Negative paresthesia. Negative blood return. Positive free-flowing CSF. Expiration date of kit checked and confirmed. Patient tolerated procedure well, without complications.

## 2022-04-10 NOTE — Op Note (Signed)
Cesarean Section Operative Note    Patient Name: Dominique Howard  MRN: [id number]  Date of Surgery: 04/10/2022   Pre-operative Diagnosis:  1) malpresentation of fetus, antepartum 2) intrauterine pregnancy at [redacted]w[redacted]d   Post-operative Diagnosis:  1) malpresentation of fetus, antepartum 2) intrauterine pregnancy at [redacted]w[redacted]d    Procedure: Primary Low Transverse Cesarean   Surgeon: Surgeon(s) and Role:    Conard Novak, MD - Primary   Assistants: Donato Schultz, CNM; No other capable assistant available, in surgery requiring high level assistant.  Anesthesia: spinal   Findings:  1) normal appearing gravid uterus, fallopian tubes, and ovaries 2) viable female infant with APGARs 7 and 9, Weight pending 3) Mild omental adhesions to lower abdominal wall near the bladder area.   Quantified Blood Loss: 840 mL  Total IV Fluids: 1,300 ml   Urine Output:  100 mL  Specimens: none  Complications: no complications  Disposition: PACU - hemodynamically stable.   Maternal Condition: stable   Baby condition / location:  NICU due to oxygen requirement with meconium noted in fluid.   Procedure Details:  The patient was seen in the Holding Room. The risks, benefits, complications, treatment options, and expected outcomes were discussed with the patient. The patient concurred with the proposed plan, giving informed consent. Identified as Dominique Howard and the procedure verified as C-Section Delivery. A Time Out was held and the above information confirmed.   After induction of anesthesia, the patient was draped and prepped in the usual sterile manner. A Pfannenstiel incision was made and carried down through the subcutaneous tissue to the fascia. Fascial incision was made and extended transversely. The fascia was separated from the underlying rectus tissue superiorly and inferiorly. The peritoneum was identified and entered. Peritoneal incision was extended longitudinally. The  bladder flap was not bluntly or sharply freed from the lower uterine segment. A low transverse uterine incision was made and the hysterotomy was extended with cranial-caudal tension. Delivered from breech (footling) presentation was a Living newborn infant(s) or Female with Apgar scores of 7 at one minute and 9 at five minutes. Cord ph was not sent the umbilical cord was clamped and cut cord blood was obtained for evaluation. The placenta was removed Intact and appeared normal. The uterine outline, tubes and ovaries appeared normal. The uterine incision was closed with running locked sutures of 0 Vicryl.  A second layer of the same suture was thrown in an imbricating fashion.  Hemostasis was assured.  The uterus was returned to the abdomen and the paracolic gutters were cleared of all clots and debris.  The omentum was divided by 2 Kelly clamps and transected with scissors. Each end was tied off with 0 Vicryl suture and verified to be hemostatic.  The rectus muscles were inspected and found to be hemostatic.  The On-Q catheter pumps were inserted in accordance with the manufacturer's recommendations.  The catheters were inserted approximately 4cm cephelad to the incision line, approximately 1cm apart, straddling the midline.  They were inserted to a depth of the 4th mark. They were positioned superficial to the rectus abdominus muscles and deep to the rectus fascia.    The fascia was then reapproximated with running sutures of 1-0 PDS, looped. The subcutaneous tissue was reapproximated using 2-0 plain gut such that no greater than 2cm of dead space remained. The subcuticular closure was performed using 4-0 monocryl. The skin closure was reinforced using surgical skin glue.  The On-Q catheters were bolused with 5 mL of  0.5% marcaine plain for a total of 10 mL.  The catheters were affixed to the skin with surgical skin glue, steri-strips, and tegaderm.    The surgical assistant performed tissue retraction,  assistance with suturing, and fundal pressure.  Instrument, sponge, and needle counts were correct prior the abdominal closure and were correct at the conclusion of the case.  The patient received Ancef 2 gram IV prior to skin incision (within 30 minutes). For VTE prophylaxis she was wearing SCDs throughout the case.  The assistant surgeon was an MD due to lack of availability of another Sales promotion account executive.    Signed: Conard Novak, MD 04/10/2022 9:18 AM

## 2022-04-11 ENCOUNTER — Encounter: Payer: Self-pay | Admitting: Obstetrics and Gynecology

## 2022-04-11 LAB — CBC
HCT: 29.9 % — ABNORMAL LOW (ref 36.0–46.0)
Hemoglobin: 9.6 g/dL — ABNORMAL LOW (ref 12.0–15.0)
MCH: 28.4 pg (ref 26.0–34.0)
MCHC: 32.1 g/dL (ref 30.0–36.0)
MCV: 88.5 fL (ref 80.0–100.0)
Platelets: 167 10*3/uL (ref 150–400)
RBC: 3.38 MIL/uL — ABNORMAL LOW (ref 3.87–5.11)
RDW: 15.3 % (ref 11.5–15.5)
WBC: 14.2 10*3/uL — ABNORMAL HIGH (ref 4.0–10.5)
nRBC: 0 % (ref 0.0–0.2)

## 2022-04-11 MED ORDER — SODIUM CHLORIDE 0.9 % IV SOLN: INTRAVENOUS | Status: DC | PRN

## 2022-04-11 MED ORDER — SODIUM CHLORIDE 0.9 % IV SOLN
300.0000 mg | Freq: Once | INTRAVENOUS | Status: AC
  Administered 2022-04-11: 300 mg via INTRAVENOUS
  Filled 2022-04-11: qty 300

## 2022-04-11 NOTE — Progress Notes (Signed)
Postop Day  1  Subjective: no complaints, up ad lib, voiding, and tolerating PO  Doing well, no concerns. Ambulating without difficulty, pain managed with PO meds, tolerating regular diet, and voiding without difficulty.   No fever/chills, chest pain, shortness of breath, nausea/vomiting, or leg pain. No nipple or breast pain. No headache, visual changes, or RUQ/epigastric pain.  Objective: BP 119/64 (BP Location: Right Leg)   Pulse 78   Temp 98.4 F (36.9 C) (Oral)   Resp 17   Ht 5\' 2"  (1.575 m)   Wt 95.3 kg   LMP 07/24/2021   SpO2 98%   Breastfeeding Unknown   BMI 38.41 kg/m    Physical Exam:  General: alert, cooperative, and no distress Breasts: soft/nontender CV: RRR Pulm: nl effort, CTABL Abdomen: soft, non-tender, active bowel sounds Uterine Fundus: firm Incision: no significant drainage, covered with occlusive OP site, ON-Q ball in place Perineum: minimal edema, intact Lochia: appropriate DVT Evaluation: No evidence of DVT seen on physical exam.  Recent Labs    04/09/22 1646 04/11/22 0534  HGB 11.3* 9.6*  HCT 34.9* 29.9*  WBC 10.6* 14.2*  PLT 179 167    Assessment/Plan: 21 y.o. G1P1001 postpartum day # 1  -Continue routine postpartum care -Acute blood loss anemia - hemodynamically stable and asymptomatic; start PO ferrous sulfate BID with stool softeners  -IV venofer x 1 dose ordered  -Immunization status:   needs Varicella prior to discharge    Disposition: Continue inpatient postpartum care    LOS: 1 day   15, CNM 04/11/2022, 1:32 PM   ----- 06/11/2022  Certified Nurse Midwife [facility]

## 2022-04-11 NOTE — Lactation Note (Signed)
This note was copied from a baby's chart. Lactation Consultation Note  Patient Name: Dominique Howard HDIXB'O Date: 04/11/2022 Reason for consult: Initial assessment;Term;Primapara;Breastfeeding assistance;RN request;Other (Comment) (Baby initially in SCN, transferred back to mom's room.) Age:19 hours  Maternal Data This is mom's 1st baby, C/S for breech.Mom with history of ADHD and anxiety. Mom is 21 y.o. Mom's plan is to breastfeed her baby and use formula supplement as needed. Per chart review baby initially to SCN for moderate/severe respiratory distress which resolved quickly. Baby transitioned back to mom's room. Per mom baby initially breastfeeding well but had spitting episdes and was not latching and feeding. Per care nurse blood sugars within normal range. Baby began latching and breastfeeding this afternoon.  Has patient been taught Hand Expression?: Yes Does the patient have breastfeeding experience prior to this delivery?: No  Feeding Mother's Current Feeding Choice: Breast Milk and Formula  Assisted mom maximize position and latch technique. Baby latched, audible swallows noted.Care nurse in attendance and assisting mom as LC exited the room. Feeding in progress, Observed 10 minutes and assisted mom with switching baby to the left breast. Mom most comfortable with football hold.  LATCH Score Latch: Grasps breast easily, tongue down, lips flanged, rhythmical sucking.  Audible Swallowing: Spontaneous and intermittent  Type of Nipple: Everted at rest and after stimulation  Comfort (Breast/Nipple): Soft / non-tender  Hold (Positioning): Assistance needed to correctly position infant at breast and maintain latch.  LATCH Score: 9    Interventions Interventions: Breast feeding basics reviewed;Assisted with latch;Breast massage;Breast compression;Adjust position;Support pillows;Position options;Education   Consult Status Consult Status: Follow-up Date:  04/11/22 Follow-up type: In-patient  Update provided to care nurse.  [name] 04/11/2022, 5:20 PM

## 2022-04-12 MED ORDER — FERROUS SULFATE 325 (65 FE) MG PO TABS: 325.0000 mg | ORAL_TABLET | Freq: Two times a day (BID) | ORAL | 3 refills | Status: AC

## 2022-04-12 MED ORDER — OXYCODONE-ACETAMINOPHEN 5-325 MG PO TABS: 1.0000 | ORAL_TABLET | ORAL | 0 refills | Status: AC | PRN

## 2022-04-12 MED ORDER — SIMETHICONE 80 MG PO CHEW: 80.0000 mg | CHEWABLE_TABLET | Freq: Three times a day (TID) | ORAL | 0 refills | Status: AC

## 2022-04-12 MED ORDER — SENNOSIDES-DOCUSATE SODIUM 8.6-50 MG PO TABS: 2.0000 | ORAL_TABLET | ORAL | Status: AC

## 2022-04-12 MED ORDER — WITCH HAZEL-GLYCERIN EX PADS: 1.0000 | MEDICATED_PAD | CUTANEOUS | 12 refills | Status: AC | PRN

## 2022-04-12 MED ORDER — COCONUT OIL OIL: 1.0000 | TOPICAL_OIL | 0 refills | Status: AC | PRN

## 2022-04-12 MED ORDER — IBUPROFEN 600 MG PO TABS: 600.0000 mg | ORAL_TABLET | Freq: Four times a day (QID) | ORAL | 0 refills | Status: AC

## 2022-04-12 MED ORDER — DIBUCAINE (PERIANAL) 1 % EX OINT: 1.0000 | TOPICAL_OINTMENT | CUTANEOUS | Status: AC | PRN

## 2022-04-12 NOTE — Progress Notes (Signed)
Mother discharged. Discharge instructions given. Mother verbalizes understanding. Transported by axillary.  

## 2023-04-27 ENCOUNTER — Emergency Department
Admission: EM | Admit: 2023-04-27 | Discharge: 2023-04-27 | Disposition: A | Discharge status: Home / Self Care | Payer: Medicaid Other | Source: Home / Self Care | Attending: Emergency Medicine | Admitting: Emergency Medicine

## 2023-04-27 ENCOUNTER — Other Ambulatory Visit: Payer: Self-pay

## 2023-04-27 DIAGNOSIS — J45909 Unspecified asthma, uncomplicated: Secondary | ICD-10-CM | POA: Diagnosis not present

## 2023-04-27 DIAGNOSIS — Z20822 Contact with and (suspected) exposure to covid-19: Secondary | ICD-10-CM | POA: Diagnosis not present

## 2023-04-27 DIAGNOSIS — R519 Headache, unspecified: Secondary | ICD-10-CM | POA: Diagnosis present

## 2023-04-27 HISTORY — DX: Migraine, unspecified, not intractable, without status migrainosus: G43.909

## 2023-04-27 LAB — SARS CORONAVIRUS 2 BY RT PCR: SARS Coronavirus 2 by RT PCR: NEGATIVE

## 2023-04-27 MED ORDER — DIPHENHYDRAMINE HCL 50 MG/ML IJ SOLN
25.0000 mg | Freq: Once | INTRAMUSCULAR | Status: AC
  Administered 2023-04-27: 25 mg via INTRAVENOUS
  Filled 2023-04-27: qty 1

## 2023-04-27 MED ORDER — KETOROLAC TROMETHAMINE 30 MG/ML IJ SOLN
15.0000 mg | Freq: Once | INTRAMUSCULAR | Status: AC
  Administered 2023-04-27: 15 mg via INTRAVENOUS
  Filled 2023-04-27: qty 1

## 2023-04-27 MED ORDER — SODIUM CHLORIDE 0.9 % IV BOLUS
1000.0000 mL | Freq: Once | INTRAVENOUS | Status: AC
  Administered 2023-04-27: 1000 mL via INTRAVENOUS

## 2023-04-27 MED ORDER — ONDANSETRON HCL 4 MG/2ML IJ SOLN
4.0000 mg | Freq: Once | INTRAMUSCULAR | Status: AC
  Administered 2023-04-27: 4 mg via INTRAVENOUS
  Filled 2023-04-27: qty 2

## 2023-04-27 NOTE — Discharge Instructions (Signed)
Follow up with your regular doctor as needed Take tylenol and ibuprofen for pain, drink plenty of water If you begin to have fever/chills recheck your covid status with an at home test

## 2023-04-27 NOTE — ED Triage Notes (Signed)
Pt to ED for migraine since yesterday, hx of same. Tried tylenol and ibuprofen. Photosensitivity. Pt in NAD.

## 2023-04-27 NOTE — ED Provider Notes (Signed)
[facility] Provider Note    Event Date/Time   First MD Initiated Contact with Patient 04/27/23 1111     (approximate)   History   Migraine   HPI  Dominique Howard is a 22 y.o. female with history of ADHD, anxiety, asthma presents emergency department complaining of migraine that has been ongoing for 2 days.  Patient states started feeling bad yesterday, today felt hot and cold like chills while at work.  Was told her face felt very warm.  States not typical of her normal migraines.  Feels little different.  Feels like she is getting sick.  No vomiting.  No diarrhea.     Physical Exam   Triage Vital Signs: ED Triage Vitals  Encounter Vitals Group     BP 04/27/23 1031 (!) 152/79     Systolic BP Percentile --      Diastolic BP Percentile --      Pulse Rate 04/27/23 1031 (!) 106     Resp 04/27/23 1031 16     Temp 04/27/23 1031 98.3 F (36.8 C)     Temp Source 04/27/23 1031 Oral     SpO2 04/27/23 1031 97 %     Weight 04/27/23 1029 215 lb (97.5 kg)     Height 04/27/23 1029 5\' 1"  (1.549 m)     Head Circumference --      Peak Flow --      Pain Score 04/27/23 1027 4     Pain Loc --      Pain Education --      Exclude from Growth Chart --     Most recent vital signs: Vitals:   04/27/23 1031 04/27/23 1313  BP: (!) 152/79 (!) 97/56  Pulse: (!) 106 (!) 58  Resp: 16 15  Temp: 98.3 F (36.8 C)   SpO2: 97% 99%     General: Awake, no distress.   CV:  Good peripheral perfusion. regular rate and  rhythm Resp:  Normal effort. Lungs cta Abd:  No distention.   Other:  Cranial nerves II through XII grossly intact, grips equal bilaterally   ED Results / Procedures / Treatments   Labs (all labs ordered are listed, but only abnormal results are displayed) Labs Reviewed  SARS CORONAVIRUS 2 BY RT PCR     EKG     RADIOLOGY     PROCEDURES:   Procedures   MEDICATIONS ORDERED IN ED: Medications  sodium chloride 0.9 % bolus  1,000 mL (0 mLs Intravenous Stopped 04/27/23 1317)  ondansetron (ZOFRAN) injection 4 mg (4 mg Intravenous Given 04/27/23 1154)  ketorolac (TORADOL) 30 MG/ML injection 15 mg (15 mg Intravenous Given 04/27/23 1149)  diphenhydrAMINE (BENADRYL) injection 25 mg (25 mg Intravenous Given 04/27/23 1151)     IMPRESSION / MDM / ASSESSMENT AND PLAN / ED COURSE  I reviewed the triage vital signs and the nursing notes.                              Differential diagnosis includes, but is not limited to, migraine, COVID, tension headache  Patient's presentation is most consistent with acute complicated illness / injury requiring diagnostic workup.   COVID swab, migraine cocktail  Patient was given normal saline 1 L IV, Toradol 15 mg IV, Zofran 4 mg IV and Benadryl 25 mg IV   Patient states she is feeling better after the medications.  COVID swab is reassuring.  He  is to the regular doctor if not improving to 3 days.  Return emergency department if worsening.  Given a work note and discharged stable condition.  Patient is in agreement treatment plan.   FINAL CLINICAL IMPRESSION(S) / ED DIAGNOSES   Final diagnoses:  Bad headache     Rx / DC Orders   ED Discharge Orders     None        Note:  This document was prepared using Dragon voice recognition software and may include unintentional dictation errors.    Faythe Ghee, PA-C 04/27/23 1536    Dionne Bucy, MD 04/27/23 [phone]

## 2023-04-27 NOTE — ED Notes (Addendum)
Patient c/o head pain that began yesterday that gets worse with light, had 2 episodes of vomiting, and lightheadedness.  Resp even and unlabored. Ambulates with a steady gait. Nad noted. MAE. Skin pwd

## 2023-10-15 ENCOUNTER — Emergency Department
Admission: EM | Admit: 2023-10-15 | Discharge: 2023-10-15 | Disposition: A | Discharge status: Home / Self Care | Payer: Medicaid Other | Attending: Emergency Medicine | Admitting: Emergency Medicine

## 2023-10-15 DIAGNOSIS — J45909 Unspecified asthma, uncomplicated: Secondary | ICD-10-CM | POA: Insufficient documentation

## 2023-10-15 DIAGNOSIS — Z7951 Long term (current) use of inhaled steroids: Secondary | ICD-10-CM | POA: Insufficient documentation

## 2023-10-15 DIAGNOSIS — R0981 Nasal congestion: Secondary | ICD-10-CM | POA: Diagnosis present

## 2023-10-15 DIAGNOSIS — J069 Acute upper respiratory infection, unspecified: Secondary | ICD-10-CM | POA: Insufficient documentation

## 2023-10-15 DIAGNOSIS — Z20822 Contact with and (suspected) exposure to covid-19: Secondary | ICD-10-CM | POA: Diagnosis not present

## 2023-10-15 LAB — RESP PANEL BY RT-PCR (RSV, FLU A&B, COVID)  RVPGX2
Influenza A by PCR: NEGATIVE
Influenza B by PCR: NEGATIVE
Resp Syncytial Virus by PCR: NEGATIVE
SARS Coronavirus 2 by RT PCR: NEGATIVE

## 2023-10-15 MED ORDER — ALBUTEROL SULFATE HFA 108 (90 BASE) MCG/ACT IN AERS: 1.0000 | INHALATION_SPRAY | Freq: Four times a day (QID) | RESPIRATORY_TRACT | 1 refills | Status: AC | PRN

## 2023-10-15 MED ORDER — BENZONATATE 100 MG PO CAPS: 100.0000 mg | ORAL_CAPSULE | Freq: Three times a day (TID) | ORAL | 0 refills | Status: AC | PRN

## 2023-10-15 NOTE — ED Triage Notes (Signed)
Pt arrived POV for congested productive cough, nasal congestion, headache, generalized body aches, for the past several days, has not checked temp. A&O x4, NAD noted. Tylenol PTA

## 2023-10-15 NOTE — Discharge Instructions (Addendum)
You tested negative for flu, COVID and RSV.  This is likely a viral illness and will resolve on its own with time.  I have sent a refill of your albuterol inhaler.  You can use this as needed for shortness of breath or wheezing.  I also sent a medication called Tessalon Perles to help with your cough.  You can also take over-the-counter cold medication like DayQuil.  Keep in mind that combination cold medications often contain Tylenol so if you need additional pain control or medicine for fever please take ibuprofen.  Return to the ED with any new or worsening symptoms.

## 2023-10-15 NOTE — ED Provider Notes (Signed)
[facility] Provider Note    Event Date/Time   First MD Initiated Contact with Patient 10/15/23 2013     (approximate)   History   Flu-like Symptoms   HPI  Dominique Howard is a 23 y.o. female with PMH of asthma, anxiety, migraines and ADHD who presents for evaluation of congestion, cough, headache, body aches for the past few days.  Patient is unsure if she has had a fever because she does not have a thermometer.  Does not endorse chills.  She states she has felt short of breath and has needed to use her inhaler.      Physical Exam   Triage Vital Signs: ED Triage Vitals  Encounter Vitals Group     BP 10/15/23 1950 (!) 143/85     Systolic BP Percentile --      Diastolic BP Percentile --      Pulse Rate 10/15/23 1950 (!) 101     Resp 10/15/23 1950 20     Temp 10/15/23 1950 98.4 F (36.9 C)     Temp Source 10/15/23 1950 Oral     SpO2 10/15/23 1950 99 %     Weight 10/15/23 1952 215 lb (97.5 kg)     Height 10/15/23 1952 5\' 3"  (1.6 m)     Head Circumference --      Peak Flow --      Pain Score 10/15/23 1952 0     Pain Loc --      Pain Education --      Exclude from Growth Chart --     Most recent vital signs: Vitals:   10/15/23 1950  BP: (!) 143/85  Pulse: (!) 101  Resp: 20  Temp: 98.4 F (36.9 C)  SpO2: 99%   General: Awake, no distress.  CV:  Good peripheral perfusion.  RRR. Resp:  Normal effort.  CTAB. Speaking in full sentences.  Abd:  No distention.    ED Results / Procedures / Treatments   Labs (all labs ordered are listed, but only abnormal results are displayed) Labs Reviewed  RESP PANEL BY RT-PCR (RSV, FLU A&B, COVID)  [id number]    PROCEDURES:  Critical Care performed: No  Procedures   MEDICATIONS ORDERED IN ED: Medications - No data to display   IMPRESSION / MDM / ASSESSMENT AND PLAN / ED COURSE  I reviewed the triage vital signs and the nursing notes.                             23 year old female  presents for evaluation of flulike symptoms.  Patient is hypertensive and slightly tachycardic in triage otherwise vital signs are stable.  On exam patient is NAD and nontoxic-appearing.  Differential diagnosis includes, but is not limited to, flu, COVID, RSV, bronchitis, pneumonia, asthma exacerbation, pulmonary embolism.   Patient's presentation is most consistent with acute, uncomplicated illness.  Respiratory panel was negative for flu, COVID and RSV.  Patient's physical exam is reassuring.  Lungs were clear to auscultation so I do not think she needs a breathing treatment at this time.  She was advised on symptomatic management using over-the-counter cold medicine.  I will also send her a refill of her albuterol inhaler and some Tessalon Perles for her to try.  Discussed return precautions.  She voiced understanding, all questions were answered and she was stable at discharge.     FINAL CLINICAL IMPRESSION(S) / ED DIAGNOSES  Final diagnoses:  Viral upper respiratory tract infection     Rx / DC Orders   ED Discharge Orders          Ordered    albuterol (VENTOLIN HFA) 108 (90 Base) MCG/ACT inhaler  Every 6 hours PRN        10/15/23 2301    benzonatate (TESSALON PERLES) 100 MG capsule  3 times daily PRN        10/15/23 2301             Note:  This document was prepared using Dragon voice recognition software and may include unintentional dictation errors.   Cameron Ali, PA-C 10/15/23 2303    Dionne Bucy, MD 10/15/23 2318

## 2024-06-15 ENCOUNTER — Other Ambulatory Visit: Payer: Self-pay | Admitting: Family

## 2024-06-15 DIAGNOSIS — R590 Localized enlarged lymph nodes: Secondary | ICD-10-CM

## 2024-06-19 ENCOUNTER — Ambulatory Visit: Attending: Family

## 2024-07-04 ENCOUNTER — Emergency Department

## 2024-07-04 ENCOUNTER — Emergency Department
Admission: EM | Admit: 2024-07-04 | Discharge: 2024-07-04 | Disposition: A | Discharge status: Home / Self Care | Attending: Emergency Medicine | Admitting: Emergency Medicine

## 2024-07-04 ENCOUNTER — Other Ambulatory Visit: Payer: Self-pay

## 2024-07-04 DIAGNOSIS — S8002XA Contusion of left knee, initial encounter: Secondary | ICD-10-CM

## 2024-07-04 DIAGNOSIS — S1093XA Contusion of unspecified part of neck, initial encounter: Secondary | ICD-10-CM

## 2024-07-04 DIAGNOSIS — S01412A Laceration without foreign body of left cheek and temporomandibular area, initial encounter: Secondary | ICD-10-CM | POA: Insufficient documentation

## 2024-07-04 DIAGNOSIS — S0181XA Laceration without foreign body of other part of head, initial encounter: Secondary | ICD-10-CM

## 2024-07-04 DIAGNOSIS — S0993XA Unspecified injury of face, initial encounter: Secondary | ICD-10-CM | POA: Diagnosis present

## 2024-07-04 DIAGNOSIS — M25562 Pain in left knee: Secondary | ICD-10-CM | POA: Insufficient documentation

## 2024-07-04 DIAGNOSIS — S0083XA Contusion of other part of head, initial encounter: Secondary | ICD-10-CM

## 2024-07-04 DIAGNOSIS — J45909 Unspecified asthma, uncomplicated: Secondary | ICD-10-CM | POA: Insufficient documentation

## 2024-07-04 MED ORDER — AMOXICILLIN-POT CLAVULANATE 875-125 MG PO TABS: 1.0000 | ORAL_TABLET | Freq: Two times a day (BID) | ORAL | 0 refills | Status: AC

## 2024-07-04 MED ORDER — LIDOCAINE-EPINEPHRINE 2 %-1:100000 IJ SOLN
20.0000 mL | Freq: Once | INTRAMUSCULAR | Status: AC
  Administered 2024-07-04: 20 mL
  Filled 2024-07-04: qty 1

## 2024-07-04 MED ORDER — HYDROCODONE-ACETAMINOPHEN 5-325 MG PO TABS: 1.0000 | ORAL_TABLET | Freq: Four times a day (QID) | ORAL | 0 refills | Status: AC | PRN
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
## 2024-07-04 NOTE — ED Notes (Signed)
Provided ice pack for comfort

## 2024-07-04 NOTE — Discharge Instructions (Addendum)
 Take the antibiotic as prescribed and finish the full 7-day course.  Keep the wound clean and dry.  Apply bacitracin or Neosporin ointment and change the dressing once or twice daily.  Do not submerge the wound underwater.  However, you are able to shower.  Return in 5 days to have the sutures removed.  You should follow-up with a dentist for the possible injury to your teeth.  We have provided a list of local dental clinics.  Return to the ER for new, worsening, or persistent severe pain, swelling, pus drainage from the wound, redness or swelling around the wound, dizziness, severe headache, vomiting, or any other new or worsening symptoms that concern you.
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
## 2024-07-09 ENCOUNTER — Emergency Department
Admission: EM | Admit: 2024-07-09 | Discharge: 2024-07-09 | Disposition: A | Discharge status: Home / Self Care | Attending: Emergency Medicine | Admitting: Emergency Medicine

## 2024-07-09 ENCOUNTER — Ambulatory Visit
Admission: RE | Admit: 2024-07-09 | Discharge: 2024-07-09 | Disposition: A | Discharge status: Home / Self Care | Source: Ambulatory Visit | Attending: Family | Admitting: Family

## 2024-07-09 ENCOUNTER — Other Ambulatory Visit: Payer: Self-pay

## 2024-07-09 DIAGNOSIS — R590 Localized enlarged lymph nodes: Secondary | ICD-10-CM | POA: Insufficient documentation

## 2024-07-09 DIAGNOSIS — Z4802 Encounter for removal of sutures: Secondary | ICD-10-CM | POA: Insufficient documentation

## 2024-07-09 DIAGNOSIS — J45909 Unspecified asthma, uncomplicated: Secondary | ICD-10-CM | POA: Insufficient documentation

## 2024-07-09 NOTE — Discharge Instructions (Addendum)
 The sutures were removed today. Please see the attached information about further wound care. You can apply vitamin E oil to help with scarring. Do not use hydrogen peroxide or alcohol to clean anymore. Please use soap and water only. Return to the ED with any worsening symptoms.

## 2024-07-09 NOTE — ED Triage Notes (Signed)
 Pt here for suture removal from L cheek. Pt had them placed on 11/1. Scabs covering sutures.
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
## 2024-08-24 ENCOUNTER — Emergency Department
Admission: EM | Admit: 2024-08-24 | Discharge: 2024-08-24 | Disposition: A | Discharge status: Home / Self Care | Attending: Emergency Medicine | Admitting: Emergency Medicine

## 2024-08-24 ENCOUNTER — Other Ambulatory Visit: Payer: Self-pay

## 2024-08-24 ENCOUNTER — Emergency Department

## 2024-08-24 DIAGNOSIS — R059 Cough, unspecified: Secondary | ICD-10-CM | POA: Diagnosis present

## 2024-08-24 DIAGNOSIS — J069 Acute upper respiratory infection, unspecified: Secondary | ICD-10-CM | POA: Insufficient documentation

## 2024-08-24 LAB — RESP PANEL BY RT-PCR (RSV, FLU A&B, COVID)  RVPGX2
Influenza A by PCR: NEGATIVE
Influenza B by PCR: NEGATIVE
Resp Syncytial Virus by PCR: NEGATIVE
SARS Coronavirus 2 by RT PCR: NEGATIVE

## 2024-08-24 MED ORDER — KETOROLAC TROMETHAMINE 30 MG/ML IJ SOLN
30.0000 mg | Freq: Once | INTRAMUSCULAR | Status: AC
  Administered 2024-08-24: 30 mg via INTRAMUSCULAR
  Filled 2024-08-24: qty 1

## 2024-08-24 MED ORDER — IPRATROPIUM-ALBUTEROL 0.5-2.5 (3) MG/3ML IN SOLN
3.0000 mL | Freq: Once | RESPIRATORY_TRACT | Status: AC
  Administered 2024-08-24: 3 mL via RESPIRATORY_TRACT
  Filled 2024-08-24: qty 3

## 2024-08-24 MED ORDER — ONDANSETRON 4 MG PO TBDP
4.0000 mg | ORAL_TABLET | Freq: Once | ORAL | Status: AC
  Administered 2024-08-24: 4 mg via ORAL
  Filled 2024-08-24: qty 1

## 2024-08-24 NOTE — ED Triage Notes (Signed)
 Pt arrives POV, ambulatory triage, gait steady, no acute distress noted c/o chest pain when she coughs, chills, congestion x 4 days.

## 2024-08-24 NOTE — Discharge Instructions (Addendum)
 You were seen in the emergency department today for a cough. Your respiratory panel which includes COVID, influenza and RSV were negative.  Your chest x-ray is normal.  Your EKG is normal.  A viral cough may last up to 2-3 weeks.   Take tylenol  or ibuprofen  for pain or fever as directed.   Stay hydrated by drinking plenty of fluids to thin mucus. Get adequate amount of sleep and avoid overexertion. Consider a humidifier at night. Warm teas and a spoonful of honey may help reduce cough frequency. Follow up with your primary care provider as needed.

## 2024-08-24 NOTE — ED Provider Notes (Signed)
 "   [facility] Emergency Department Provider Note     Event Date/Time   First MD Initiated Contact with Patient 08/24/24 2125     (approximate)   History   Cough and Generalized Body Aches   HPI  Dominique Howard is a 23 y.o. female presents to the ED for evaluation of congestion, cough, generalized body aches and pleuritic chest pain during cough x 4 days.  Patient reports she has been taking NyQuil with some relief.  Unknown sick contacts.     Physical Exam   Triage Vital Signs: ED Triage Vitals  Encounter Vitals Group     BP 08/24/24 2117 126/79     Girls Systolic BP Percentile --      Girls Diastolic BP Percentile --      Boys Systolic BP Percentile --      Boys Diastolic BP Percentile --      Pulse Rate 08/24/24 2117 84     Resp 08/24/24 2117 20     Temp 08/24/24 2117 98.9 F (37.2 C)     Temp Source 08/24/24 2117 Oral     SpO2 08/24/24 2117 96 %     Weight --      Height --      Head Circumference --      Peak Flow --      Pain Score 08/24/24 2114 7     Pain Loc --      Pain Education --      Exclude from Growth Chart --     Most recent vital signs: Vitals:   08/24/24 2117  BP: 126/79  Pulse: 84  Resp: 20  Temp: 98.9 F (37.2 C)  SpO2: 96%   General: Nontoxic-appearing.  Alert and oriented. INAD. Head:  NCAT.  Eyes:  PERRLA. EOMI.  Nose:   Mucosa is moist. rhinorrhea. CV:  Good peripheral perfusion. RRR. RESP:  Normal effort. LCTAB. No retractions.  NEURO: Cranial nerves intact. No focal deficits.     ED Results / Procedures / Treatments   Labs (all labs ordered are listed, but only abnormal results are displayed) Labs Reviewed  RESP PANEL BY RT-PCR (RSV, FLU A&B, COVID)  [id number]    EKG  Normal sinus rhythm  RADIOLOGY  I personally viewed and evaluated these images as part of my medical decision making, as well as reviewing the written report by the radiologist.  ED Provider Interpretation: No focal  consolidation noted  DG Chest 2 View Result Date: 08/24/2024 EXAM: 2 VIEW(S) XRAY OF THE CHEST 08/24/2024 09:43:00 PM COMPARISON: None available. CLINICAL HISTORY: cough FINDINGS: LUNGS AND PLEURA: No focal pulmonary opacity. No pleural effusion. No pneumothorax. HEART AND MEDIASTINUM: No acute abnormality of the cardiac and mediastinal silhouettes. BONES AND SOFT TISSUES: No acute osseous abnormality. IMPRESSION: 1. No acute cardiopulmonary process. Electronically signed by: Norman Gatlin MD 08/24/2024 10:00 PM EST RP Workstation: [id number]    PROCEDURES:  Critical Care performed: No  Procedures   MEDICATIONS ORDERED IN ED: Medications  ipratropium-albuterol  (DUONEB) 0.5-2.5 (3) MG/3ML nebulizer solution 3 mL (3 mLs Nebulization Given 08/24/24 2257)  ondansetron  (ZOFRAN -ODT) disintegrating tablet 4 mg (4 mg Oral Given 08/24/24 2255)  ketorolac  (TORADOL ) 30 MG/ML injection 30 mg (30 mg Intramuscular Given 08/24/24 2253)     IMPRESSION / MDM / ASSESSMENT AND PLAN / ED COURSE  I reviewed the triage vital signs and the nursing notes.  Clinical Course as of 08/24/24 2329  Mon Aug 24, 2024  2213 EKG 12-Lead NSR [MH]  2213 DG Chest 2 View IMPRESSION: 1. No acute cardiopulmonary process.   [MH]    Clinical Course User Index [MH] Margrette Monte A, PA-C    23 y.o. female presents to the emergency department for evaluation and treatment of acute cough. See HPI for further details.   Differential diagnosis includes, but is not limited to viral URI, PNA  Patient's presentation is most consistent with acute complicated illness / injury requiring diagnostic workup.  Patient is alert and oriented.  She is hemodynamic stable and afebrile.  Physical exam findings are stated above and no red flag signs.  Normal lung exam.  EKG reveals normal sinus rhythm.  Chest x-ray is normal.  Respiratory panel is negative.  Pleuritic chest pain due to intermittent  coughing.  Presentation is consistent with a viral URI.  Education on symptomatic care was provided to patient.  Patient is stable condition for discharge home.  Work note provided at discharge.  ED return precaution discussed.  FINAL CLINICAL IMPRESSION(S) / ED DIAGNOSES   Final diagnoses:  Upper respiratory tract infection, unspecified type   Rx / DC Orders   ED Discharge Orders     None      Note:  This document was prepared using Dragon voice recognition software and may include unintentional dictation errors.    Margrette, Haddon Fyfe A, PA-C 08/24/24 2329  "
